# Patient Record
Sex: Male | Born: 1970 | Race: White | Hispanic: No | Smoking: Former smoker
Health system: Southern US, Community
[De-identification: ages and names within clinical notes are randomized; demographics above are authoritative.]

## PROBLEM LIST (undated history)

## (undated) DIAGNOSIS — R109 Unspecified abdominal pain: Secondary | ICD-10-CM

## (undated) DIAGNOSIS — K449 Diaphragmatic hernia without obstruction or gangrene: Secondary | ICD-10-CM

## (undated) DIAGNOSIS — A048 Other specified bacterial intestinal infections: Secondary | ICD-10-CM

## (undated) DIAGNOSIS — K219 Gastro-esophageal reflux disease without esophagitis: Secondary | ICD-10-CM

## (undated) DIAGNOSIS — G8929 Other chronic pain: Secondary | ICD-10-CM

## (undated) DIAGNOSIS — Z87891 Personal history of nicotine dependence: Secondary | ICD-10-CM

## (undated) DIAGNOSIS — F419 Anxiety disorder, unspecified: Secondary | ICD-10-CM

## (undated) DIAGNOSIS — J449 Chronic obstructive pulmonary disease, unspecified: Secondary | ICD-10-CM

## (undated) HISTORY — DX: Gastro-esophageal reflux disease without esophagitis: K21.9

## (undated) HISTORY — PX: HERNIA REPAIR: SHX51

## (undated) HISTORY — DX: Anxiety disorder, unspecified: F41.9

## (undated) HISTORY — DX: Chronic obstructive pulmonary disease, unspecified: J44.9

---

## 2003-04-20 ENCOUNTER — Emergency Department (HOSPITAL_COMMUNITY): Admission: EM | Admit: 2003-04-20 | Discharge: 2003-04-20 | Payer: Self-pay | Admitting: *Deleted

## 2007-04-10 ENCOUNTER — Emergency Department (HOSPITAL_COMMUNITY): Admission: EM | Admit: 2007-04-10 | Discharge: 2007-04-10 | Payer: Self-pay | Admitting: Emergency Medicine

## 2009-12-22 ENCOUNTER — Emergency Department (HOSPITAL_COMMUNITY): Admission: EM | Admit: 2009-12-22 | Discharge: 2009-12-23 | Payer: Self-pay | Admitting: Emergency Medicine

## 2009-12-30 ENCOUNTER — Emergency Department (HOSPITAL_COMMUNITY): Admission: EM | Admit: 2009-12-30 | Discharge: 2009-12-30 | Payer: Self-pay | Admitting: Emergency Medicine

## 2010-02-02 ENCOUNTER — Ambulatory Visit (HOSPITAL_COMMUNITY): Admission: RE | Admit: 2010-02-02 | Discharge: 2010-02-02 | Payer: Self-pay | Admitting: Surgery

## 2010-04-11 ENCOUNTER — Emergency Department (HOSPITAL_COMMUNITY): Admission: EM | Admit: 2010-04-11 | Discharge: 2010-04-12 | Payer: Self-pay | Admitting: Emergency Medicine

## 2010-04-13 ENCOUNTER — Emergency Department (HOSPITAL_COMMUNITY): Admission: EM | Admit: 2010-04-13 | Discharge: 2010-04-14 | Payer: Self-pay | Admitting: Emergency Medicine

## 2010-05-19 ENCOUNTER — Emergency Department (HOSPITAL_COMMUNITY): Admission: EM | Admit: 2010-05-19 | Discharge: 2010-05-19 | Payer: Self-pay | Admitting: Emergency Medicine

## 2010-05-21 ENCOUNTER — Emergency Department (HOSPITAL_COMMUNITY): Admission: EM | Admit: 2010-05-21 | Discharge: 2010-05-22 | Payer: Self-pay | Admitting: Emergency Medicine

## 2010-05-28 ENCOUNTER — Emergency Department (HOSPITAL_COMMUNITY): Admission: EM | Admit: 2010-05-28 | Discharge: 2010-05-28 | Payer: Self-pay | Admitting: Emergency Medicine

## 2010-05-29 ENCOUNTER — Emergency Department (HOSPITAL_COMMUNITY): Admission: EM | Admit: 2010-05-29 | Discharge: 2010-05-29 | Payer: Self-pay | Admitting: Emergency Medicine

## 2010-06-15 ENCOUNTER — Emergency Department (HOSPITAL_COMMUNITY): Admission: EM | Admit: 2010-06-15 | Discharge: 2010-06-15 | Payer: Self-pay | Admitting: Emergency Medicine

## 2010-06-29 ENCOUNTER — Ambulatory Visit: Payer: Self-pay | Admitting: Gastroenterology

## 2010-06-29 ENCOUNTER — Encounter: Payer: Self-pay | Admitting: Internal Medicine

## 2010-06-29 DIAGNOSIS — M545 Low back pain, unspecified: Secondary | ICD-10-CM | POA: Insufficient documentation

## 2010-06-29 DIAGNOSIS — R634 Abnormal weight loss: Secondary | ICD-10-CM

## 2010-06-29 DIAGNOSIS — R198 Other specified symptoms and signs involving the digestive system and abdomen: Secondary | ICD-10-CM

## 2010-06-29 DIAGNOSIS — R1013 Epigastric pain: Secondary | ICD-10-CM

## 2010-06-30 ENCOUNTER — Encounter: Payer: Self-pay | Admitting: Internal Medicine

## 2010-07-02 ENCOUNTER — Emergency Department (HOSPITAL_COMMUNITY): Admission: EM | Admit: 2010-07-02 | Discharge: 2010-07-03 | Payer: Self-pay | Admitting: Emergency Medicine

## 2010-07-07 ENCOUNTER — Ambulatory Visit (HOSPITAL_COMMUNITY): Admission: RE | Admit: 2010-07-07 | Discharge: 2010-07-07 | Payer: Self-pay | Admitting: Internal Medicine

## 2010-07-07 ENCOUNTER — Ambulatory Visit: Payer: Self-pay | Admitting: Internal Medicine

## 2010-07-10 ENCOUNTER — Encounter: Payer: Self-pay | Admitting: Internal Medicine

## 2010-07-10 ENCOUNTER — Ambulatory Visit (HOSPITAL_COMMUNITY): Admission: RE | Admit: 2010-07-10 | Discharge: 2010-07-10 | Payer: Self-pay | Admitting: Internal Medicine

## 2010-07-11 ENCOUNTER — Encounter (HOSPITAL_COMMUNITY): Admission: RE | Admit: 2010-07-11 | Discharge: 2010-08-10 | Payer: Self-pay | Admitting: Internal Medicine

## 2010-07-12 ENCOUNTER — Encounter: Payer: Self-pay | Admitting: Internal Medicine

## 2010-07-14 ENCOUNTER — Encounter: Payer: Self-pay | Admitting: Internal Medicine

## 2010-07-14 ENCOUNTER — Encounter (INDEPENDENT_AMBULATORY_CARE_PROVIDER_SITE_OTHER): Payer: Self-pay

## 2010-07-15 ENCOUNTER — Emergency Department (HOSPITAL_COMMUNITY): Admission: EM | Admit: 2010-07-15 | Discharge: 2010-07-15 | Payer: Self-pay | Admitting: Emergency Medicine

## 2010-07-18 ENCOUNTER — Encounter: Payer: Self-pay | Admitting: Internal Medicine

## 2010-11-28 NOTE — Assessment & Plan Note (Signed)
Summary: CONSTIPATION-DIVERTICULOSIS-SELF-REFERRAL/JBB   Visit Type:  Initial Visit Primary Care Provider:  None  Chief Complaint:  constipation and diverticulosis.  History of Present Illness: Warren Miller is a pleasant 40 y/o WM, who presents for further evaluation of abd pain. He has been to ED 5-6 times both at [facility] and [facility]. He had two CTs, labs and no cause of abd pain found.   Symptoms began in March, started having abd pain (diffuse), constipation. Passed tarry stool. Few days later after Ex-lax, passed mucous and bloody stool. Then developed Left inguinal hernia, repaired 4/11 by Dr. Luretha Murphy. Did good until, 6/11. Developed bad bronchitis, took several types of meds including antibiotics. Shortly after getting over bronchitis, developed more abd pain. Always has urged to have BM. Usually abd pain better after BM. Severe epigastric pain at times, n/v. No heartburn. No pp abd pain. [facility] ED physician recommended TCS. Some days without pain. Gets very anxious about symptoms. Last night, SOB, tried to use inhaler, developed sharp shooting pain in entire abd, lasted ten minutes. Feels like gas builds up. Miralax and stool softners for one week after first CT, recommended by ED physician. Has been Golytely as well. BM are very soft, 1-3 per day. Before this, would have one daily BM. 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 No more black stools since March.   Went to ED at [facility] 06/20/10 for ongoing pain. Given protonix but could not afford.   Feels bad all the time, fatigue. Also c/o lower back pain for two years with radiation down left leg and sometimes right leg. No previous w/u.   CT A/P 06/15/10: one cm right hepatic cyst, normal appendix.  CT A/P 05/28/10: appendix borderline in diameter, measuring 7m with ? very minimal stranding around mid and distal portion of appendix, scattered descending colonic diverticula.  Labs 06/15/10: lipase 28, Cre 0.91, Tbili 0.8, AP 67, AST 17, ALT 16, alb  4.1, WBC 4900, H/H 14.8/43.9, Plt 148,000.  Current Medications (verified): 1)  Protonix 40 Mg Tbec (Pantoprazole Sodium) .... Take 1 Tablet By Mouth Once A Day 2)  Maalox .... As Needed  Allergies (verified): No Known Drug Allergies  Past History:  Past Medical History: Chronic back pain with radiation down left leg and sometimes right, no w/u yet. Going on for two years.  Past Surgical History: Left inguinal hernia repair, 4/11  Family History: No FH of CRC, colon cancer. Both parents, liver problems, alcohol related.  Social History: Laid-off two yrs ago, [place]. Married. Two sons, age 74 and 51. Quit smoking 7 yrs ago, smoked for 17 yrs. Still chews tobacco. No alcohol or drugs.   Review of Systems General:  Complains of fatigue and weight loss; denies fever, chills, sweats, anorexia, and weakness; 10 pounds. Eyes:  Denies vision loss. ENT:  Denies nasal congestion, sore throat, hoarseness, and difficulty swallowing. CV:  Denies chest pains, angina, palpitations, dyspnea on exertion, and peripheral edema. Resp:  Denies dyspnea at rest, dyspnea with exercise, cough, sputum, and coughing up blood. GI:  See HPI. GU:  Denies urinary burning and blood in urine. MS:  Complains of low back pain. Derm:  Denies rash and itching. Neuro:  See HPI; Denies weakness, frequent headaches, memory loss, and confusion. Psych:  Denies depression and anxiety. Endo:  Complains of unusual weight change. Heme:  Denies bruising and bleeding. Allergy:  Denies hives and rash.  Vital Signs:  Patient profile:   40 year old male Height:  73 inches Weight:      166 pounds BMI:     21.98 Temp:     98.0 degrees F oral Pulse rate:   72 / minute BP sitting:   120 / 80  (left arm) Cuff size:   regular  Vitals Entered By: Cloria Spring LPN (June 29, 2010 2:14 PM)  Physical Exam  General:  Well developed, well nourished, no acute distress. Head:  Normocephalic and atraumatic. Eyes:   sclera nonicteric Mouth:  Oropharyngeal mucosa moist, pink.  No lesions, erythema or exudate.   poor dentition.   Neck:  Supple; no masses or thyromegaly. Lungs:  Clear throughout to auscultation. Heart:  Regular rate and rhythm; no murmurs, rubs,  or bruits. Abdomen:  Soft. Flat. Positive BS. Moderate epig tenderness. No HSM or masses. No abd bruit or hernia. No rebound or guarding.  Rectal:  deferred until time of colonoscopy.   Extremities:  No clubbing, cyanosis, edema or deformities noted. Neurologic:  Alert and  oriented x4;  grossly normal neurologically. Skin:  Intact without significant lesions or rashes. Cervical Nodes:  No significant cervical adenopathy. Psych:  Alert and cooperative. Normal mood and affect.  Impression & Recommendations:  Problem # 1:  EPIGASTRIC PAIN (ICD-789.06)  Severe intermittent epigastric pain unrelated to meals. No typical heartburn. Some N/V. Remote black stool and bloody stool.  CT X 2 unrevealing. Labs okay. Start Dexilant 60mg  by mouth daily. #20 given. EGD to r/o PUD, malignancy, atypical GERD. EGD to be performed in near future.  Risks, alternatives, benefits including but not limited to risk of reaction to medications, bleeding, infection, and perforation addressed.  Patient voiced understanding and verbal consent obtained.   Orders: New Patient Level III (84132)  Problem # 2:  CHANGE IN BOWELS (ICD-787.99)  Change in bowels with more frequent stools and intermittent diffuse abd pain. Pain usually better post-BM. Overall fatigue/feels poorly, 10 pound wt loss associated with change in bowels last two months. Etiology not clear. Initial CT showed borderline diameter of appendix but looked normal on second CT. Colonoscopy to be performed in near future.  Risks, alternatives, and benefits including but not limited to the risk of reaction to medication, bleeding, infection, and perforation were addressed.  Patient voiced understanding and provided  verbal consent.   Orders: New Patient Level III (44010)  Problem # 3:  BACK PAIN, LUMBAR, CHRONIC (ICD-724.2)  Two year h/o chronic lower back pain with radiculopathy. Consider MRI lumbar spine after initial w/u of GI issues. Patient does not have PCP.   Orders: New Patient Level III (27253)  Appended Document: CONSTIPATION-DIVERTICULOSIS-SELF-REFERRAL/JBB Reviewed records from [facility] ED. AAS, single loop of bowel upper limits of normal in distention in RLQ. Nonspecific finding.

## 2010-11-28 NOTE — Miscellaneous (Signed)
Summary: path  Clinical Lists Changes SP-Surgical Pathology - STATUS: Final  .                                         Perform Date: 20 Sep11 00:01  Ordered By: DEFAULT MD , PROVIDER         Ordered Date:  Facility: [facility]                               Department: CPATH  Service Report Text  [facility]   [address]   [place] , [place] [address]   Telephone : [phone] Fax : [phone]    REPORT OF SURGICAL PATHOLOGY    Accession #: [id number]   Patient Name: Warren Miller, Warren Miller   Visit # : [id number]    MRN: [id number]   Physician: Eula Listen   DOB/Age [birth year] (Age: 9) Gender: M   Collected Date: 07/07/2010   Received Date: 07/10/2010    FINAL DIAGNOSIS    1. Colon, polyp(s), between sigmoid/descending :   TUBULAR ADENOMA.NO HIGH GRADE DYSPLASIA OR MALIGNANCY IDENTIFIED (ONE FRAGMENT).    2. Colon, polyp(s), sigmoid :   TUBULAR ADENOMA.NO HIGH GRADE DYSPLASIA OR MALIGNANCY IDENTIFIED (ONE FRAGMENT).    ELECTRONIC SIGNATURE : Dierdre Searles M.D., Gwendolyn Grant, Pathologist, Electronic Signature    MICROSCOPIC DESCRIPTION    CASE COMMENTS    CLINICAL HISTORY    SPECIMEN(S) OBTAINED   1. Colon, polyp(s), Between Sigmoid/descending   2. Colon, polyp(s), Sigmoid    SPECIMEN COMMENTS:   2. abdominal pain; change in bowel habits    Gross Description   1. Received in formalin are tan, soft tissue fragments that are submitted in   toto.Number: one, Size: 0.4 cm.   2. Received in formalin are tan, soft tissue fragments that are submitted in   toto.Number: one, Size: 0.3 cm.(GP:gt, 07/10/10)    Report signed out from the following location(s)   [facility]   [address], [place] [address] CLIA #: [id number]    [facility]   [address] [place], [place] [address] CLIA #: [id number]  Additional Information  HL7 RESULT STATUS : F  External IF Update Timestamp : 2010-07-11:17:03:00.000000

## 2010-11-28 NOTE — Letter (Signed)
Summary: HIDA SCAN ORDER  HIDA SCAN ORDER   Imported By: Ave Filter 07/10/2010 12:24:58  _____________________________________________________________________  External Attachment:    Type:   Image     Comment:   External Document

## 2010-11-28 NOTE — Letter (Signed)
Summary: LABS  LABS   Imported By: Rexene Alberts 06/30/2010 10:59:36  _____________________________________________________________________  External Attachment:    Type:   Image     Comment:   External Document

## 2010-11-28 NOTE — Letter (Signed)
Summary: RADIOLOGY CT ABD/PEL  RADIOLOGY CT ABD/PEL   Imported By: Rexene Alberts 06/30/2010 10:58:00  _____________________________________________________________________  External Attachment:    Type:   Image     Comment:   External Document

## 2010-11-28 NOTE — Letter (Signed)
Summary: Patient Notice, Colon Biopsy Results  [facility]  [address]   [place], [place] [address]   Phone: [phone]  Fax: [phone]       July 12, 2010   Warren Miller [address] [facility] [address] [place], [place]  [address] 12-03-1970    Dear [name],  I am pleased to inform you that the biopsies taken during your recent colonoscopy did not show any evidence of cancer upon pathologic examination.  Additional information/recommendations:  You should have a repeat colonoscopy examination  in 7 years.  Please call us if you are having persistent problems or have questions about your condition that have not been fully answered at this time.  Sincerely,    R. Roetta Sessions MD, FACP [facility] Ph: [phone]    Fax: [phone]   Appended Document: Patient Notice, Colon Biopsy Results letter mailed to pt

## 2010-11-28 NOTE — Letter (Signed)
Summary: HIDA SCAN ORDER  HIDA SCAN ORDER   Imported By: Ave Filter 07/10/2010 14:10:16  _____________________________________________________________________  External Attachment:    Type:   Image     Comment:   External Document

## 2010-11-28 NOTE — Letter (Signed)
Summary: ED NOTES FROM [facility]  ED NOTES FROM [facility]   Imported By: Rexene Alberts 07/14/2010 09:49:50  _____________________________________________________________________  External Attachment:    Type:   Image     Comment:   External Document

## 2010-11-28 NOTE — Letter (Signed)
Summary: Patient Notice, Colon Biopsy Results  [facility]  [address]   [place], [place] [address]   Phone: [phone]  Fax: [phone]       July 18, 2010   NATHANIAL ARRIGHI [address] [facility] [address] [place], [place]  [address] December 03, 1970    Dear Mr. TROUTEN,  I am pleased to inform you that the biopsies taken during your recent colonoscopy did not show any evidence of cancer upon pathologic examination.  Additional information/recommendations:  Continue with the treatment plan as outlined on the day of your exam.  You should have a repeat colonoscopy examination  in 7 years.  Please call us if you are having persistent problems or have questions about your condition that have not been fully answered at this time.  Sincerely,    R. Roetta Sessions MD, FACP [facility] Ph: [phone]    Fax: [phone]   Appended Document: Patient Notice, Colon Biopsy Results Letter mailed to pt.  Appended Document: Patient Notice, Colon Biopsy Results Reminder placed in idx.

## 2010-11-28 NOTE — Letter (Signed)
Summary: EGD/TCS ORDER  EGD/TCS ORDER   Imported By: Ave Filter 06/29/2010 15:29:53  _____________________________________________________________________  External Attachment:    Type:   Image     Comment:   External Document

## 2010-12-30 ENCOUNTER — Emergency Department (HOSPITAL_COMMUNITY): Payer: Self-pay

## 2010-12-30 ENCOUNTER — Emergency Department (HOSPITAL_COMMUNITY)
Admission: EM | Admit: 2010-12-30 | Discharge: 2010-12-30 | Disposition: A | Discharge status: Home / Self Care | Payer: Self-pay | Attending: Emergency Medicine | Admitting: Emergency Medicine

## 2010-12-30 ENCOUNTER — Encounter (HOSPITAL_COMMUNITY): Payer: Self-pay | Admitting: Radiology

## 2010-12-30 DIAGNOSIS — R0602 Shortness of breath: Secondary | ICD-10-CM | POA: Insufficient documentation

## 2010-12-30 DIAGNOSIS — F411 Generalized anxiety disorder: Secondary | ICD-10-CM | POA: Insufficient documentation

## 2010-12-30 DIAGNOSIS — R0989 Other specified symptoms and signs involving the circulatory and respiratory systems: Secondary | ICD-10-CM | POA: Insufficient documentation

## 2010-12-30 DIAGNOSIS — R0609 Other forms of dyspnea: Secondary | ICD-10-CM | POA: Insufficient documentation

## 2010-12-30 HISTORY — DX: Personal history of nicotine dependence: Z87.891

## 2011-01-11 LAB — COMPREHENSIVE METABOLIC PANEL
AST: 17 U/L (ref 0–37)
Albumin: 4 g/dL (ref 3.5–5.2)
Alkaline Phosphatase: 67 U/L (ref 39–117)
Chloride: 103 mEq/L (ref 96–112)
GFR calc Af Amer: >60 mL/min (ref >60)
Glucose, Bld: 89 mg/dL (ref 70–99)
Sodium: 137 mEq/L (ref 135–145)
Total Bilirubin: 0.8 mg/dL (ref 0.3–1.2)
Total Protein: 6.8 g/dL (ref 6.0–8.3)

## 2011-01-11 LAB — URINALYSIS, ROUTINE W REFLEX MICROSCOPIC
Bilirubin Urine: NEGATIVE
Glucose, UA: NEGATIVE mg/dL
Ketones, ur: 15 mg/dL — AB
Nitrite: NEGATIVE
Protein, ur: NEGATIVE mg/dL
Urobilinogen, UA: 0.2 mg/dL (ref 0.0–1.0)
pH: 7 (ref 5.0–8.0)

## 2011-01-11 LAB — CBC
HCT: 42.5 % (ref 39.0–52.0)
Hemoglobin: 14.8 g/dL (ref 13.0–17.0)
MCH: 30.8 pg (ref 26.0–34.0)
MCHC: 34.5 g/dL (ref 30.0–36.0)
Platelets: 151 10*3/uL (ref 150–400)
RBC: 4.87 MIL/uL (ref 4.22–5.81)
RDW: 12.7 % (ref 11.5–15.5)
RDW: 12.8 % (ref 11.5–15.5)

## 2011-01-11 LAB — DIFFERENTIAL
Basophils Absolute: 0 10*3/uL (ref 0.0–0.1)
Eosinophils Relative: 2 % (ref 0–5)
Lymphocytes Relative: 26 % (ref 12–46)
Monocytes Absolute: 0.5 10*3/uL (ref 0.1–1.0)
Monocytes Absolute: 0.4 10*3/uL (ref 0.1–1.0)
Monocytes Relative: 9 % (ref 3–12)
Neutro Abs: 3.4 10*3/uL (ref 1.7–7.7)
Neutrophils Relative %: 70 % (ref 43–77)

## 2011-01-11 LAB — BASIC METABOLIC PANEL
Calcium: 9.6 mg/dL (ref 8.4–10.5)
Chloride: 104 mEq/L (ref 96–112)
GFR calc non Af Amer: >60 mL/min (ref >60)
Potassium: 3.4 mEq/L — ABNORMAL LOW (ref 3.5–5.1)

## 2011-01-11 LAB — LIPASE, BLOOD: Lipase: 28 U/L (ref 11–59)

## 2011-01-12 LAB — POCT I-STAT, CHEM 8
BUN: 6 mg/dL (ref 6–23)
Calcium, Ion: 1.05 mmol/L — ABNORMAL LOW (ref 1.12–1.32)
Chloride: 106 mEq/L (ref 96–112)
Creatinine, Ser: 0.8 mg/dL (ref 0.4–1.5)
Hemoglobin: 15.3 g/dL (ref 13.0–17.0)
Potassium: 3.5 mEq/L (ref 3.5–5.1)
Sodium: 139 mEq/L (ref 135–145)
TCO2: 24 mmol/L (ref 0–100)

## 2011-01-12 LAB — POCT CARDIAC MARKERS: CKMB, poc: <1 ng/mL — ABNORMAL LOW (ref 1.0–8.0)

## 2011-01-12 LAB — D-DIMER, QUANTITATIVE: D-Dimer, Quant: 0.22 ug/mL-FEU (ref 0.00–0.48)

## 2011-01-13 LAB — URINALYSIS, ROUTINE W REFLEX MICROSCOPIC
Bilirubin Urine: NEGATIVE
Glucose, UA: NEGATIVE mg/dL
Hgb urine dipstick: NEGATIVE
Ketones, ur: NEGATIVE mg/dL
Protein, ur: NEGATIVE mg/dL
pH: 6 (ref 5.0–8.0)

## 2011-01-13 LAB — DIFFERENTIAL
Basophils Relative: 0 % (ref 0–1)
Eosinophils Absolute: 0 10*3/uL (ref 0.0–0.7)
Eosinophils Absolute: 0.1 10*3/uL (ref 0.0–0.7)
Eosinophils Relative: 1 % (ref 0–5)
Lymphocytes Relative: 20 % (ref 12–46)
Lymphs Abs: 1.2 10*3/uL (ref 0.7–4.0)
Lymphs Abs: 1.4 10*3/uL (ref 0.7–4.0)
Monocytes Absolute: 0.5 10*3/uL (ref 0.1–1.0)
Neutro Abs: 6.5 10*3/uL (ref 1.7–7.7)
Neutrophils Relative %: 76 % (ref 43–77)

## 2011-01-13 LAB — LIPASE, BLOOD: Lipase: 28 U/L (ref 11–59)

## 2011-01-13 LAB — CBC
HCT: 43.4 % (ref 39.0–52.0)
HCT: 44.6 % (ref 39.0–52.0)
Hemoglobin: 15.1 g/dL (ref 13.0–17.0)
Hemoglobin: 15.3 g/dL (ref 13.0–17.0)
MCH: 30.6 pg (ref 26.0–34.0)
MCH: 30.9 pg (ref 26.0–34.0)
MCHC: 34.9 g/dL (ref 30.0–36.0)
MCHC: 34.3 g/dL (ref 30.0–36.0)
MCV: 90.1 fL (ref 78.0–100.0)

## 2011-01-13 LAB — BASIC METABOLIC PANEL
BUN: 9 mg/dL (ref 6–23)
GFR calc Af Amer: >60 mL/min (ref >60)
GFR calc non Af Amer: >60 mL/min (ref >60)
Glucose, Bld: 122 mg/dL — ABNORMAL HIGH (ref 70–99)

## 2011-01-13 LAB — COMPREHENSIVE METABOLIC PANEL
ALT: 25 U/L (ref 0–53)
BUN: 5 mg/dL — ABNORMAL LOW (ref 6–23)
CO2: 27 mEq/L (ref 19–32)
Calcium: 9.1 mg/dL (ref 8.4–10.5)
GFR calc non Af Amer: >60 mL/min (ref >60)
Glucose, Bld: 109 mg/dL — ABNORMAL HIGH (ref 70–99)
Sodium: 138 mEq/L (ref 135–145)

## 2011-01-15 ENCOUNTER — Emergency Department (HOSPITAL_COMMUNITY)
Admission: EM | Admit: 2011-01-15 | Discharge: 2011-01-15 | Disposition: A | Discharge status: Home / Self Care | Payer: Self-pay | Attending: Emergency Medicine | Admitting: Emergency Medicine

## 2011-01-15 ENCOUNTER — Emergency Department (HOSPITAL_COMMUNITY): Payer: Self-pay

## 2011-01-15 DIAGNOSIS — F411 Generalized anxiety disorder: Secondary | ICD-10-CM | POA: Insufficient documentation

## 2011-01-15 DIAGNOSIS — R079 Chest pain, unspecified: Secondary | ICD-10-CM | POA: Insufficient documentation

## 2011-01-15 LAB — DIFFERENTIAL
Basophils Relative: 1 % (ref 0–1)
Eosinophils Absolute: 0 10*3/uL (ref 0.0–0.7)
Lymphs Abs: 1.1 10*3/uL (ref 0.7–4.0)
Neutrophils Relative %: 60 % (ref 43–77)

## 2011-01-15 LAB — POCT CARDIAC MARKERS
CKMB, poc: <1 ng/mL — ABNORMAL LOW (ref 1.0–8.0)
Troponin i, poc: <0.05 ng/mL (ref 0.00–0.09)

## 2011-01-15 LAB — COMPREHENSIVE METABOLIC PANEL
Alkaline Phosphatase: 68 U/L (ref 39–117)
BUN: 7 mg/dL (ref 6–23)
CO2: 25 mEq/L (ref 19–32)
Calcium: 9.1 mg/dL (ref 8.4–10.5)
Glucose, Bld: 95 mg/dL (ref 70–99)
Total Protein: 6.7 g/dL (ref 6.0–8.3)

## 2011-01-15 LAB — CBC
Platelets: 178 10*3/uL (ref 150–400)
RBC: 5.06 MIL/uL (ref 4.22–5.81)
WBC: 4 10*3/uL (ref 4.0–10.5)

## 2011-01-19 LAB — URINALYSIS, ROUTINE W REFLEX MICROSCOPIC
Glucose, UA: NEGATIVE mg/dL
Protein, ur: NEGATIVE mg/dL
pH: 7 (ref 5.0–8.0)

## 2011-01-22 LAB — URINALYSIS, ROUTINE W REFLEX MICROSCOPIC
Ketones, ur: NEGATIVE mg/dL
Nitrite: NEGATIVE
pH: 7.5 (ref 5.0–8.0)

## 2011-01-31 IMAGING — US US ABDOMEN COMPLETE
1 series · 14 of 25 positions shown · non-contrast
Comparison: CT 06/15/2010

CLINICAL DATA: Pain

COMPLETE ABDOMINAL ULTRASOUND

[Series 1: us abdomen complete · 0.26mm/px · 14 of 73 slices shown]
[im 1/73]
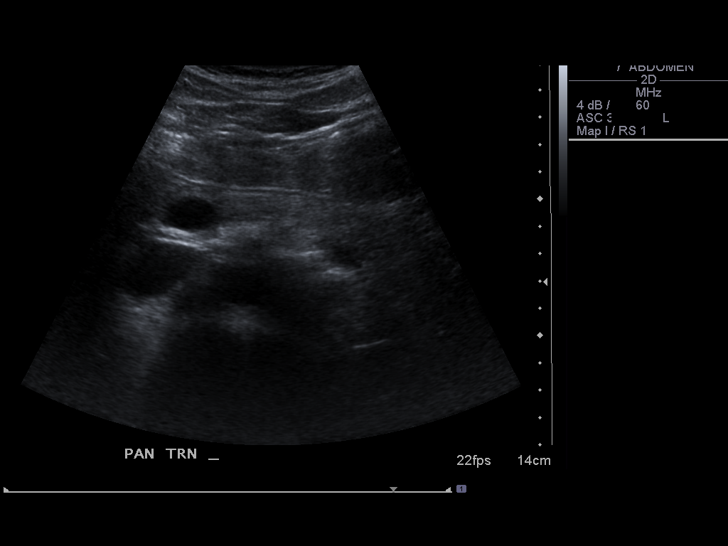
[im 7/73]
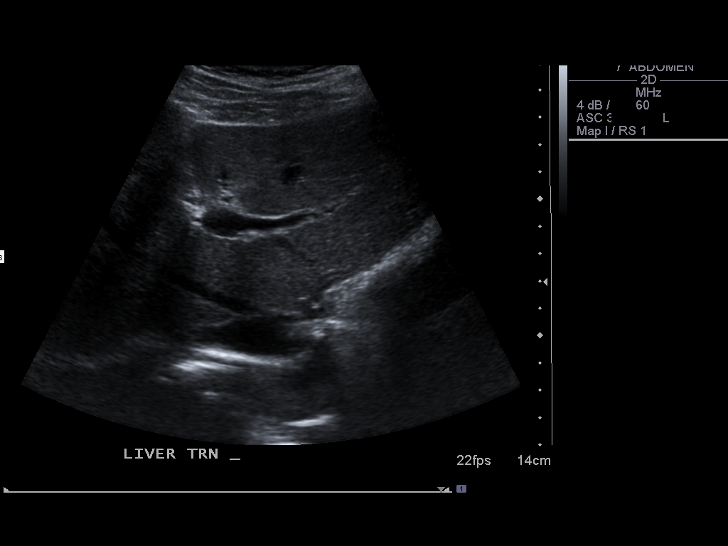
[im 13/73]
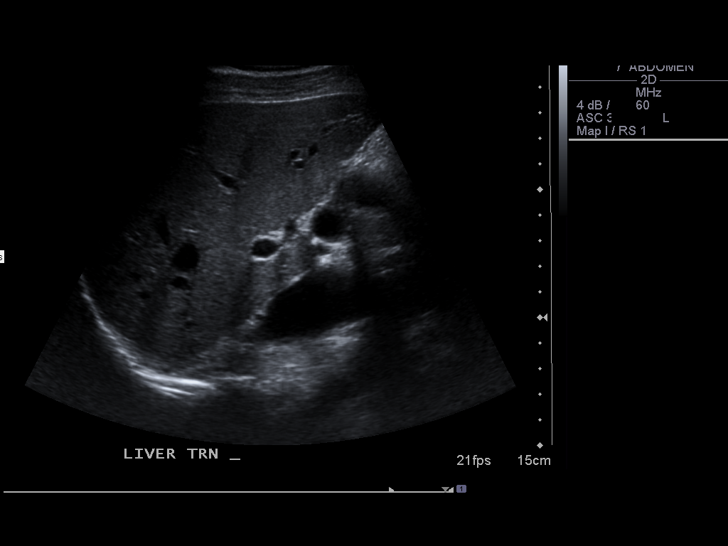
[im 19/73]
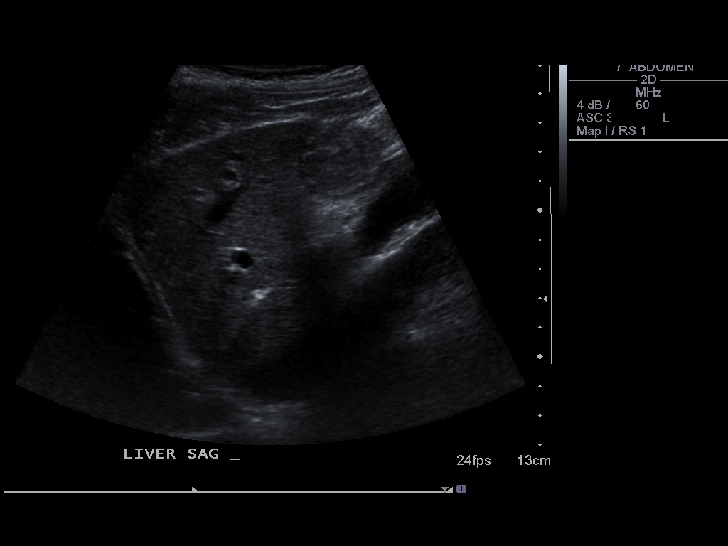
[im 25/73]
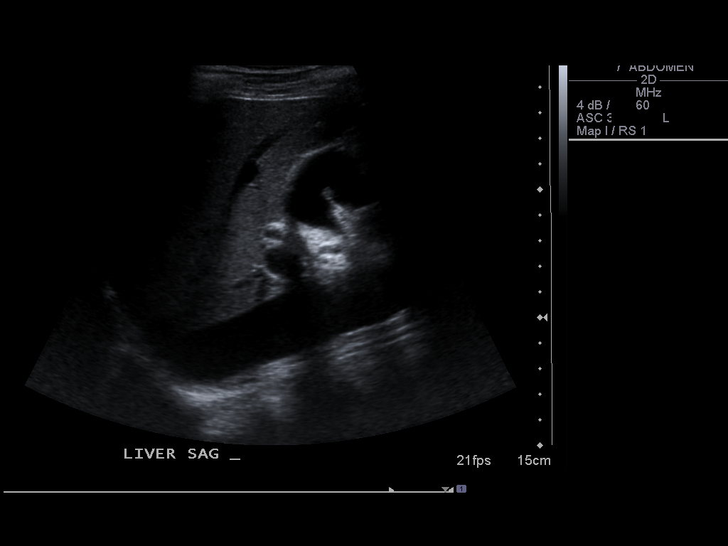
[im 28/73]
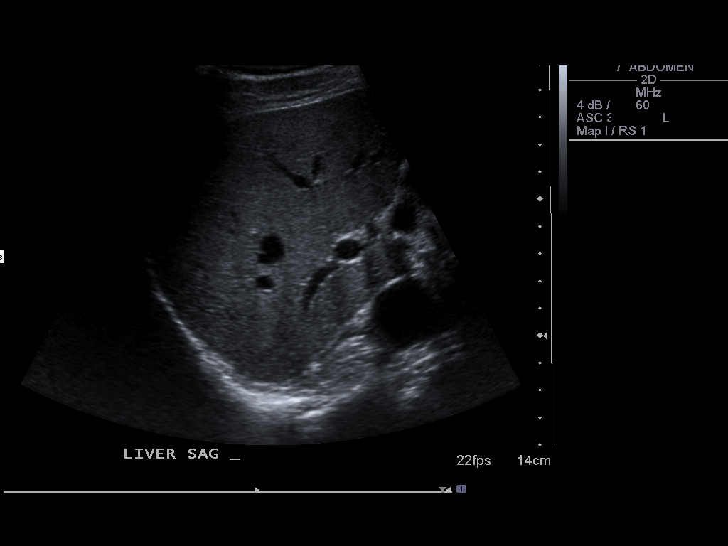
[im 34/73]
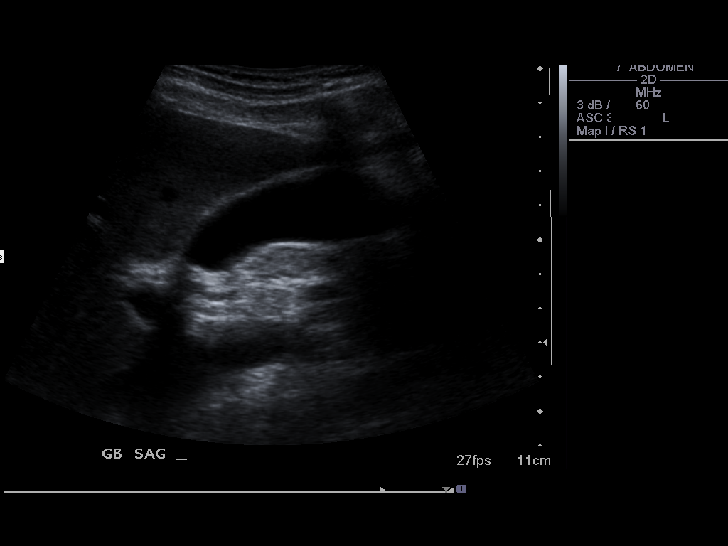
[im 40/73]
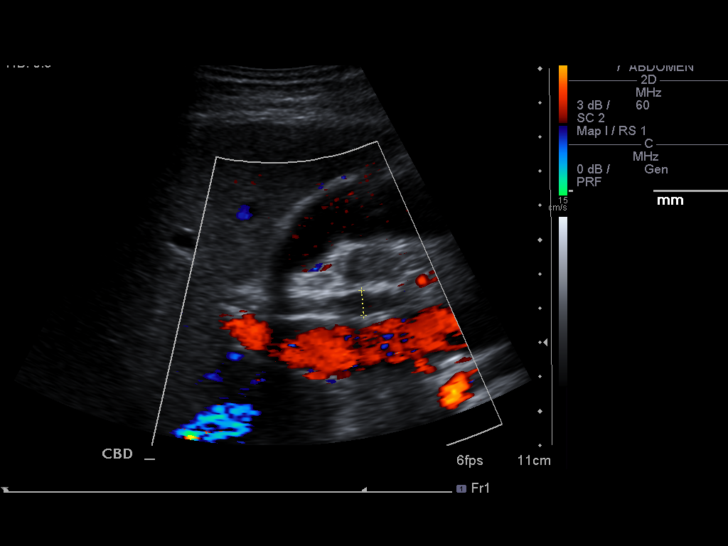
[im 46/73]
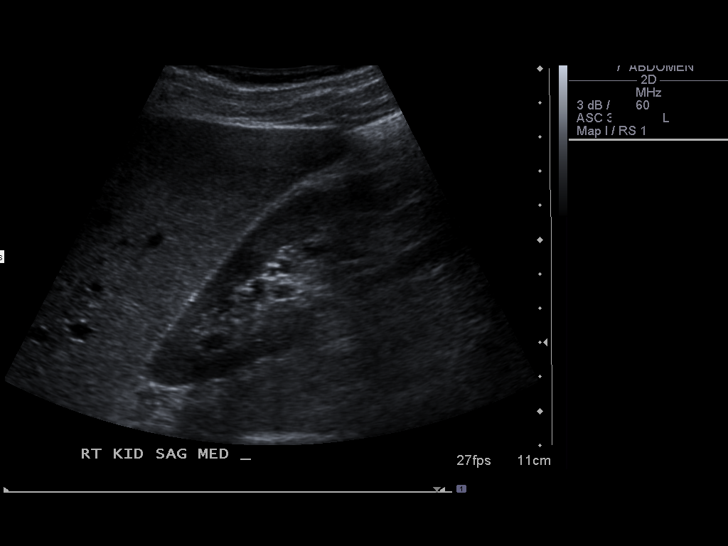
[im 49/73]
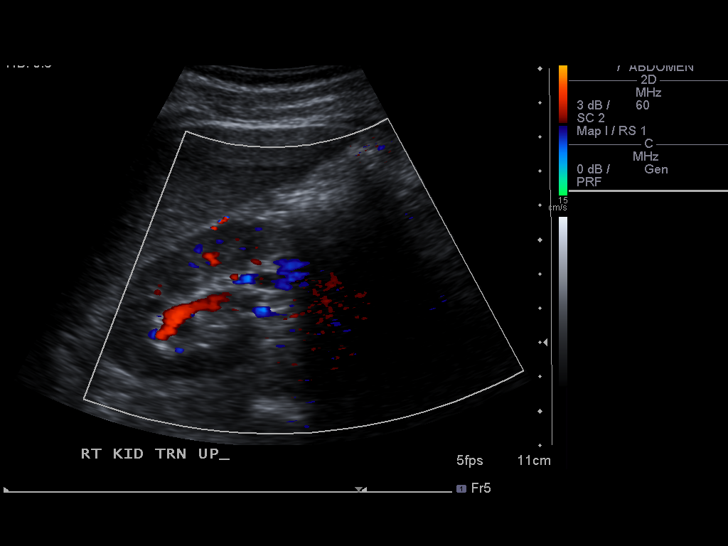
[im 55/73]
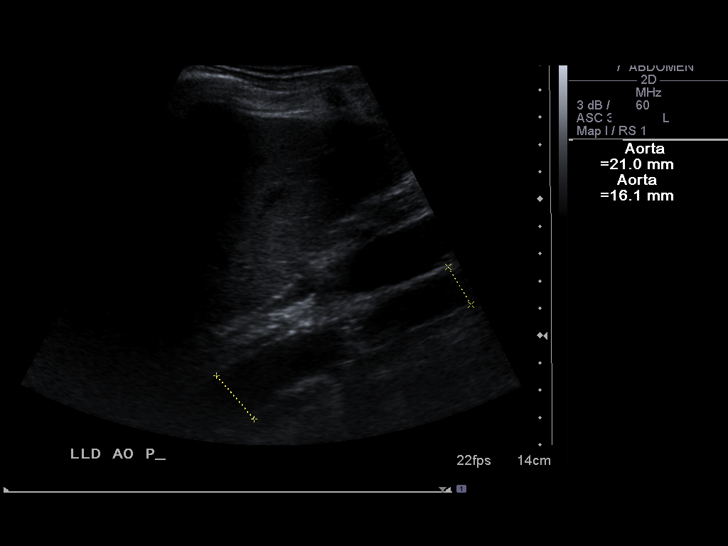
[im 61/73]
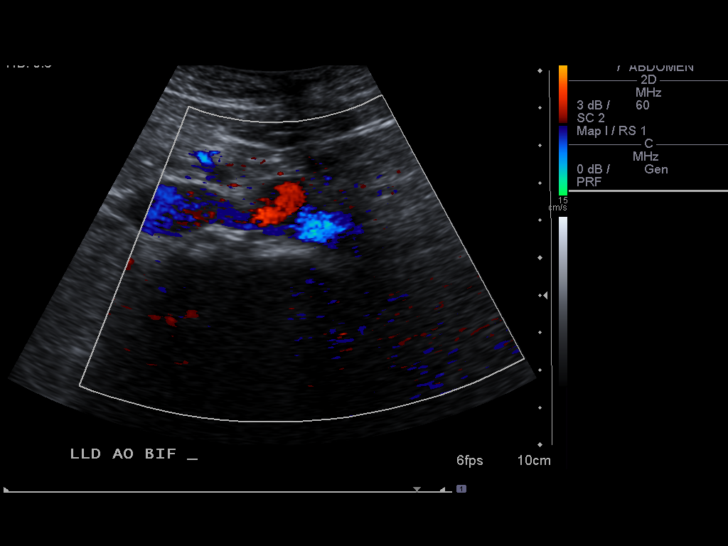
[im 67/73]
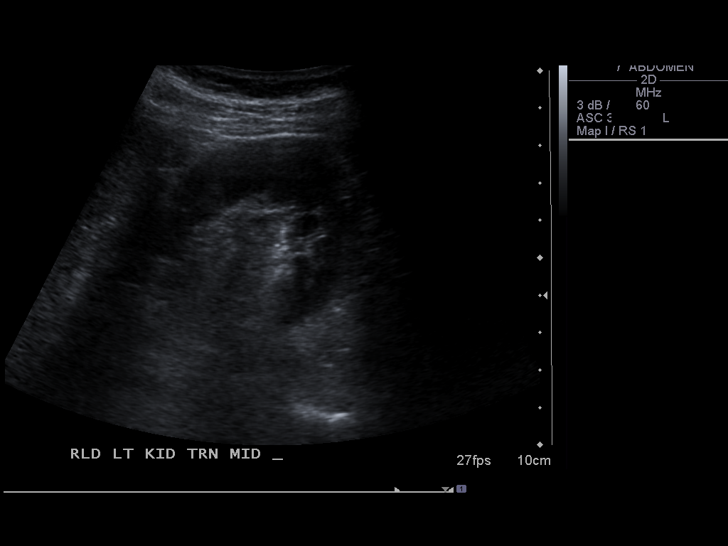
[im 73/73]
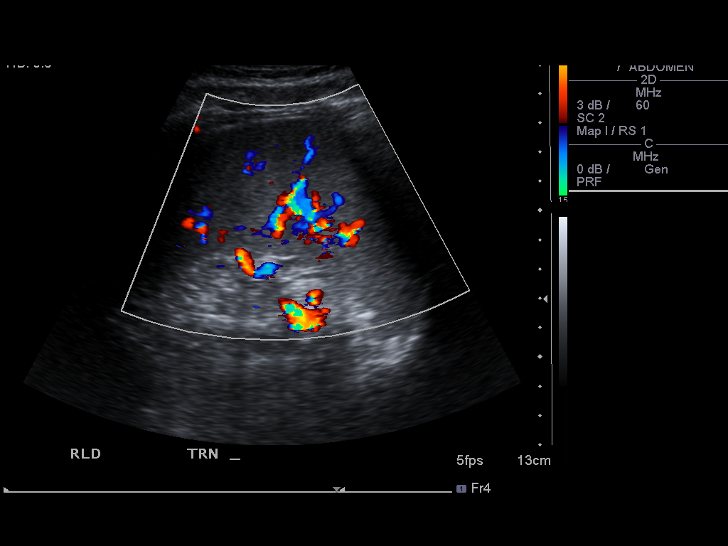

[14 of 25 positions shown; findings below may reference images not displayed]

FINDINGS: Gallbladder:  No gallstones, gallbladder wall thickening, or
pericholecystic fluid. Sonographer reports no sonographic Murphy's
sign.

Common bile duct:  Mildly dilated, 7.3 mm diameter

Liver:  No focal lesion identified.  Within normal limits in
parenchymal echogenicity.

IVC:  Appears normal.

Pancreas:  No focal abnormality seen.

Spleen:  8 cm craniocaudal length, unremarkable

Right Kidney:  11.6 cm. No hydronephrosis.  Well-preserved cortex.
Normal size and parenchymal echotexture without focal
abnormalities.

Left Kidney:  10.2 cm. No hydronephrosis.  Well-preserved cortex.
Normal size and parenchymal echotexture without focal
abnormalities. .

Abdominal aorta:  No aneurysm identified.
IMPRESSION: 1.  Mild dilatation of the common bile duct without intrahepatic
biliary ductal dilatation or cholelithiasis.  Correlate with any
clinical or laboratory evidence of obstruction.

## 2011-03-29 ENCOUNTER — Emergency Department (HOSPITAL_COMMUNITY)
Admission: EM | Admit: 2011-03-29 | Discharge: 2011-03-29 | Disposition: A | Discharge status: Home / Self Care | Payer: No Typology Code available for payment source | Attending: Emergency Medicine | Admitting: Emergency Medicine

## 2011-03-29 ENCOUNTER — Emergency Department (HOSPITAL_COMMUNITY): Payer: No Typology Code available for payment source

## 2011-03-29 DIAGNOSIS — M542 Cervicalgia: Secondary | ICD-10-CM | POA: Insufficient documentation

## 2011-03-29 DIAGNOSIS — S335XXA Sprain of ligaments of lumbar spine, initial encounter: Secondary | ICD-10-CM | POA: Insufficient documentation

## 2011-03-29 DIAGNOSIS — Y998 Other external cause status: Secondary | ICD-10-CM | POA: Insufficient documentation

## 2011-03-29 DIAGNOSIS — F17200 Nicotine dependence, unspecified, uncomplicated: Secondary | ICD-10-CM | POA: Insufficient documentation

## 2012-06-03 ENCOUNTER — Other Ambulatory Visit: Payer: Self-pay

## 2012-06-03 DIAGNOSIS — J449 Chronic obstructive pulmonary disease, unspecified: Secondary | ICD-10-CM

## 2012-06-19 ENCOUNTER — Ambulatory Visit (HOSPITAL_COMMUNITY)
Admission: RE | Admit: 2012-06-19 | Discharge: 2012-06-19 | Disposition: A | Discharge status: Home / Self Care | Payer: Self-pay | Source: Ambulatory Visit | Attending: Physician Assistant | Admitting: Physician Assistant

## 2012-06-19 DIAGNOSIS — R0602 Shortness of breath: Secondary | ICD-10-CM | POA: Insufficient documentation

## 2012-06-19 NOTE — Procedures (Signed)
NAME:  Warren Miller, Warren Miller NO.:  [id number]  MEDICAL RECORD NO.:  [id number]  LOCATION:  RESP                          FACILITY:  [facility]  PHYSICIAN:  Artin Mceuen L. Juanetta Gosling, M.D.DATE OF BIRTH:  April 18, 1971  DATE OF PROCEDURE: DATE OF DISCHARGE:                           PULMONARY FUNCTION TEST   Reason for pulmonary function testing is COPD.  1. Spirometry shows no ventilatory defect and no evidence of airflow     obstruction. 2. Lung volumes show air trapping. 3. DLCO is normal. 4. Airway resistance is somewhat low. 5. Although he has a clinical diagnosis of COPD, his current pulmonary     function test is essentially normal.     Cortny Bambach L. Juanetta Gosling, M.D.     [facility]/MEDQ  D:  06/19/2012  T:  06/19/2012  Job:  [id number]

## 2012-06-25 LAB — PULMONARY FUNCTION TEST

## 2012-10-11 ENCOUNTER — Encounter (HOSPITAL_COMMUNITY): Payer: Self-pay

## 2012-10-11 ENCOUNTER — Emergency Department (HOSPITAL_COMMUNITY)
Admission: EM | Admit: 2012-10-11 | Discharge: 2012-10-11 | Disposition: A | Discharge status: Home / Self Care | Payer: Self-pay | Attending: Emergency Medicine | Admitting: Emergency Medicine

## 2012-10-11 DIAGNOSIS — K0889 Other specified disorders of teeth and supporting structures: Secondary | ICD-10-CM

## 2012-10-11 DIAGNOSIS — K029 Dental caries, unspecified: Secondary | ICD-10-CM | POA: Insufficient documentation

## 2012-10-11 DIAGNOSIS — K089 Disorder of teeth and supporting structures, unspecified: Secondary | ICD-10-CM | POA: Insufficient documentation

## 2012-10-11 DIAGNOSIS — Z8619 Personal history of other infectious and parasitic diseases: Secondary | ICD-10-CM | POA: Insufficient documentation

## 2012-10-11 DIAGNOSIS — Z87891 Personal history of nicotine dependence: Secondary | ICD-10-CM | POA: Insufficient documentation

## 2012-10-11 HISTORY — DX: Other specified bacterial intestinal infections: A04.8

## 2012-10-11 MED ORDER — HYDROCODONE-ACETAMINOPHEN 5-325 MG PO TABS: 1.0000 | ORAL_TABLET | Freq: Four times a day (QID) | ORAL | Status: AC | PRN

## 2012-10-11 MED ORDER — AMOXICILLIN 500 MG PO CAPS: 500.0000 mg | ORAL_CAPSULE | Freq: Three times a day (TID) | ORAL | Status: DC

## 2012-10-11 NOTE — ED Notes (Signed)
Pt reports toothache to front of mouth

## 2012-10-11 NOTE — ED Provider Notes (Signed)
History     CSN: [id number]  Arrival date & time 10/11/12  1027   First MD Initiated Contact with Patient 10/11/12 1036      Chief Complaint  Patient presents with  . Dental Pain    (Consider location/radiation/quality/duration/timing/severity/associated sxs/prior treatment) HPI Comments: Trying to get seen at a dental clinic.  Patient is a 41 y.o. male presenting with tooth pain. The history is provided by the patient. No language interpreter was used.  Dental PainThe primary symptoms include mouth pain. Primary symptoms do not include dental injury or fever. Episode onset: chronic. The symptoms are worsening. The symptoms occur constantly.  Additional symptoms do not include: dental sensitivity to temperature, purulent gums, trismus, facial swelling and swollen glands.    Past Medical History  Diagnosis Date  . Former heavy cigarette smoker (20-39 per day)   . H. pylori infection     Past Surgical History  Procedure Date  . Hernia repair     No family history on file.  History  Substance Use Topics  . Smoking status: Smoker, Current Status Unknown  . Smokeless tobacco: Not on file  . Alcohol Use: No      Review of Systems  Constitutional: Negative for fever and chills.  HENT: Positive for dental problem. Negative for facial swelling.   All other systems reviewed and are negative.    Allergies  Other  Home Medications   Current Outpatient Rx  Name  Route  Sig  Dispense  Refill  . CLONAZEPAM 0.5 MG PO TABS   Oral   Take 0.5 mg by mouth 5 (five) times daily.         . IBUPROFEN 200 MG PO TABS   Oral   Take 200 mg by mouth every 6 (six) hours as needed. For pain         . AMOXICILLIN 500 MG PO CAPS   Oral   Take 1 capsule (500 mg total) by mouth 3 (three) times daily.   30 capsule   0   . HYDROCODONE-ACETAMINOPHEN 5-325 MG PO TABS   Oral   Take 1 tablet by mouth every 6 (six) hours as needed for pain.   20 tablet   0     BP 111/60   Pulse 88  Temp 97.9 F (36.6 C) (Oral)  Resp 18  Wt 176 lb (79.833 kg)  SpO2 100%  Physical Exam  Nursing note and vitals reviewed. Constitutional: He is oriented to person, place, and time. He appears well-developed and well-nourished.  HENT:  Head: Normocephalic and atraumatic.  Mouth/Throat: Uvula is midline, oropharynx is clear and moist and mucous membranes are normal. Dental caries present. No uvula swelling.    Eyes: EOM are normal.  Neck: Normal range of motion.  Cardiovascular: Normal rate, regular rhythm and intact distal pulses.   Pulmonary/Chest: Effort normal. No respiratory distress.  Abdominal: Soft. He exhibits no distension. There is no tenderness.  Musculoskeletal: Normal range of motion.  Neurological: He is alert and oriented to person, place, and time.  Skin: Skin is warm and dry.  Psychiatric: He has a normal mood and affect. Judgment normal.    ED Course  Procedures (including critical care time)  Labs Reviewed - No data to display No results found.   1. Pain, dental       MDM  rx-amoxil 500 mg, 30 rx-hydrocodone, 20 Ibuprofen F/u with dentist.        Evalina Field, PA 10/11/12 1111

## 2012-10-11 NOTE — ED Provider Notes (Signed)
Medical screening examination/treatment/procedure(s) were performed by non-physician practitioner and as supervising physician I was immediately available for consultation/collaboration. Kadisha Goodine, MD, FACEP   Talton Delpriore L Sholom Dulude, MD 10/11/12 1455 

## 2013-02-16 ENCOUNTER — Telehealth: Payer: Self-pay

## 2013-02-16 ENCOUNTER — Telehealth: Payer: Self-pay | Admitting: Family Medicine

## 2013-02-16 NOTE — Telephone Encounter (Signed)
For what?

## 2013-02-16 NOTE — Telephone Encounter (Signed)
Ok reviewed.

## 2013-02-16 NOTE — Telephone Encounter (Signed)
Left message for pt to return call.

## 2013-02-16 NOTE — Telephone Encounter (Signed)
Patient wants a referral to [facility] for MRI that Greig Castilla was suppose to schedule

## 2013-02-16 NOTE — Telephone Encounter (Signed)
Please review and if no further action required,  please close encounter.  Had no insurance.  Has had lower back pain with a sharp pain off and on. ACaryn Bee had suggested an mri would be helpful in the future.  At [facility] he applied for some health assistance and was told he might could get a referral started while waiting to see if his medicaid would come through.  Without any insurance or cash I said it probably was best to wait for coverage.  He had an x-ray that apparently showed no problems.

## 2013-02-17 NOTE — Telephone Encounter (Signed)
Per Dr. Modesto Charon patient will need to be seen again and have lumbar x-rays before an MRI can be scheduled.

## 2013-02-17 NOTE — Telephone Encounter (Signed)
Left message on voicemail for patient to return call. 

## 2013-02-19 NOTE — Telephone Encounter (Signed)
Left messon cell phone  to call for appt  Mess left on vm to call for appt on his home number Have tried and left messages since 02-16-13

## 2013-04-06 DIAGNOSIS — F419 Anxiety disorder, unspecified: Secondary | ICD-10-CM | POA: Insufficient documentation

## 2013-04-06 DIAGNOSIS — K4090 Unilateral inguinal hernia, without obstruction or gangrene, not specified as recurrent: Secondary | ICD-10-CM | POA: Insufficient documentation

## 2013-07-03 DIAGNOSIS — N509 Disorder of male genital organs, unspecified: Secondary | ICD-10-CM | POA: Diagnosis not present

## 2013-07-03 DIAGNOSIS — N50811 Right testicular pain: Secondary | ICD-10-CM | POA: Insufficient documentation

## 2013-07-29 DIAGNOSIS — F411 Generalized anxiety disorder: Secondary | ICD-10-CM | POA: Diagnosis not present

## 2013-08-03 DIAGNOSIS — N509 Disorder of male genital organs, unspecified: Secondary | ICD-10-CM | POA: Diagnosis not present

## 2013-08-05 DIAGNOSIS — N508 Other specified disorders of male genital organs: Secondary | ICD-10-CM | POA: Diagnosis not present

## 2013-08-05 DIAGNOSIS — N509 Disorder of male genital organs, unspecified: Secondary | ICD-10-CM | POA: Diagnosis not present

## 2013-09-18 ENCOUNTER — Encounter (INDEPENDENT_AMBULATORY_CARE_PROVIDER_SITE_OTHER): Payer: Self-pay

## 2013-09-18 ENCOUNTER — Ambulatory Visit (INDEPENDENT_AMBULATORY_CARE_PROVIDER_SITE_OTHER): Payer: Medicare Other | Admitting: Family Medicine

## 2013-09-18 ENCOUNTER — Encounter: Payer: Self-pay | Admitting: Family Medicine

## 2013-09-18 VITALS — BP 113/73 | HR 80 | Temp 97.3°F | Ht 73.0 in | Wt 180.0 lb

## 2013-09-18 DIAGNOSIS — J309 Allergic rhinitis, unspecified: Secondary | ICD-10-CM

## 2013-09-18 DIAGNOSIS — J069 Acute upper respiratory infection, unspecified: Secondary | ICD-10-CM

## 2013-09-18 DIAGNOSIS — R062 Wheezing: Secondary | ICD-10-CM | POA: Diagnosis not present

## 2013-09-18 MED ORDER — METHYLPREDNISOLONE ACETATE 40 MG/ML IJ SUSP
80.0000 mg | Freq: Once | INTRAMUSCULAR | Status: AC
  Administered 2013-09-18: 80 mg via INTRAMUSCULAR

## 2013-09-18 MED ORDER — AZITHROMYCIN 250 MG PO TABS: ORAL_TABLET | ORAL | Status: DC

## 2013-09-18 MED ORDER — LORATADINE 10 MG PO TABS: 10.0000 mg | ORAL_TABLET | Freq: Every day | ORAL | Status: DC

## 2013-09-18 NOTE — Progress Notes (Signed)
  Subjective:    Patient ID: Warren Miller, male    DOB: 1971-08-19, 42 y.o.   MRN: [id number]  HPI  URI Symptoms Onset: 1-2 months  Description: rhinorrhea, post nasal drip, cough, mild wheezing  Modifying factors:  Former smoker   Symptoms Nasal discharge: yes Fever: no Sore throat: mild Cough: yes Wheezing: faint  Ear pain: no GI symptoms: no Sick contacts: no  Red Flags  Stiff neck: no Dyspnea: no Rash: no Swallowing difficulty: no  Sinusitis Risk Factors Headache/face pain: no Double sickening: no tooth pain: no  Allergy Risk Factors Sneezing: yes Itchy scratchy throat: no Seasonal symptoms: yes  Flu Risk Factors Headache: no muscle aches: no severe fatigue: no     Review of Systems  All other systems reviewed and are negative.       Objective:   Physical Exam  Constitutional: He appears well-developed and well-nourished.  HENT:  Head: Normocephalic and atraumatic.  Right Ear: External ear normal.  Left Ear: External ear normal.  +nasal erythema, rhinorrhea bilaterally, + post oropharyngeal erythema    Eyes: Conjunctivae are normal. Pupils are equal, round, and reactive to light.  Neck: Normal range of motion. Neck supple.  Cardiovascular: Normal rate and regular rhythm.   Pulmonary/Chest: Effort normal.  Faint wheezes in apices    Abdominal: Soft.  Musculoskeletal: Normal range of motion.  Neurological: He is alert.  Skin: Skin is warm.          Assessment & Plan:  Wheezing - Plan: methylPREDNISolone acetate (DEPO-MEDROL) injection 80 mg  URI (upper respiratory infection) - Plan: azithromycin (ZITHROMAX) 250 MG tablet  Allergic rhinitis - Plan: loratadine (CLARITIN) tablet 10 mg  Likley overlapping URI, sinusitis, allergic rhinitis.  Depomedrol 80 mg IM x1 for wheezing  zpak for atypical sxs.  Start claritin Discussed general and infectious/ENT red flags.  Follow up as needed.

## 2013-10-02 DIAGNOSIS — J310 Chronic rhinitis: Secondary | ICD-10-CM | POA: Diagnosis not present

## 2013-10-02 DIAGNOSIS — J342 Deviated nasal septum: Secondary | ICD-10-CM | POA: Diagnosis not present

## 2013-10-20 DIAGNOSIS — F411 Generalized anxiety disorder: Secondary | ICD-10-CM | POA: Diagnosis not present

## 2013-11-02 DIAGNOSIS — J342 Deviated nasal septum: Secondary | ICD-10-CM | POA: Diagnosis not present

## 2013-11-02 DIAGNOSIS — J310 Chronic rhinitis: Secondary | ICD-10-CM | POA: Diagnosis not present

## 2013-12-29 DIAGNOSIS — Z23 Encounter for immunization: Secondary | ICD-10-CM | POA: Diagnosis not present

## 2013-12-29 DIAGNOSIS — J42 Unspecified chronic bronchitis: Secondary | ICD-10-CM | POA: Diagnosis not present

## 2013-12-29 DIAGNOSIS — G8929 Other chronic pain: Secondary | ICD-10-CM | POA: Diagnosis not present

## 2013-12-29 DIAGNOSIS — M5430 Sciatica, unspecified side: Secondary | ICD-10-CM | POA: Diagnosis not present

## 2013-12-29 DIAGNOSIS — Z79899 Other long term (current) drug therapy: Secondary | ICD-10-CM | POA: Diagnosis not present

## 2013-12-29 DIAGNOSIS — Z720 Tobacco use: Secondary | ICD-10-CM | POA: Insufficient documentation

## 2013-12-29 DIAGNOSIS — F17200 Nicotine dependence, unspecified, uncomplicated: Secondary | ICD-10-CM | POA: Diagnosis not present

## 2013-12-29 DIAGNOSIS — F411 Generalized anxiety disorder: Secondary | ICD-10-CM | POA: Diagnosis not present

## 2013-12-29 DIAGNOSIS — M545 Low back pain, unspecified: Secondary | ICD-10-CM | POA: Diagnosis not present

## 2014-01-19 DIAGNOSIS — F411 Generalized anxiety disorder: Secondary | ICD-10-CM | POA: Diagnosis not present

## 2014-03-10 ENCOUNTER — Emergency Department (HOSPITAL_COMMUNITY): Payer: Medicare Other

## 2014-03-10 ENCOUNTER — Emergency Department (HOSPITAL_COMMUNITY)
Admission: EM | Admit: 2014-03-10 | Discharge: 2014-03-10 | Disposition: A | Discharge status: Home / Self Care | Payer: Medicare Other | Attending: Emergency Medicine | Admitting: Emergency Medicine

## 2014-03-10 ENCOUNTER — Encounter (HOSPITAL_COMMUNITY): Payer: Self-pay | Admitting: Emergency Medicine

## 2014-03-10 ENCOUNTER — Telehealth: Payer: Self-pay | Admitting: Family Medicine

## 2014-03-10 DIAGNOSIS — K838 Other specified diseases of biliary tract: Secondary | ICD-10-CM | POA: Insufficient documentation

## 2014-03-10 DIAGNOSIS — R079 Chest pain, unspecified: Secondary | ICD-10-CM | POA: Diagnosis not present

## 2014-03-10 DIAGNOSIS — J449 Chronic obstructive pulmonary disease, unspecified: Secondary | ICD-10-CM | POA: Diagnosis not present

## 2014-03-10 DIAGNOSIS — Z79899 Other long term (current) drug therapy: Secondary | ICD-10-CM | POA: Insufficient documentation

## 2014-03-10 DIAGNOSIS — Z87891 Personal history of nicotine dependence: Secondary | ICD-10-CM | POA: Insufficient documentation

## 2014-03-10 DIAGNOSIS — R1011 Right upper quadrant pain: Secondary | ICD-10-CM

## 2014-03-10 DIAGNOSIS — Z9889 Other specified postprocedural states: Secondary | ICD-10-CM | POA: Insufficient documentation

## 2014-03-10 DIAGNOSIS — Z8619 Personal history of other infectious and parasitic diseases: Secondary | ICD-10-CM | POA: Insufficient documentation

## 2014-03-10 DIAGNOSIS — G8929 Other chronic pain: Secondary | ICD-10-CM | POA: Insufficient documentation

## 2014-03-10 DIAGNOSIS — K7689 Other specified diseases of liver: Secondary | ICD-10-CM | POA: Diagnosis not present

## 2014-03-10 HISTORY — DX: Unspecified abdominal pain: R10.9

## 2014-03-10 HISTORY — DX: Other chronic pain: G89.29

## 2014-03-10 LAB — COMPREHENSIVE METABOLIC PANEL
ALBUMIN: 3.9 g/dL (ref 3.5–5.2)
ALT: 22 U/L (ref 0–53)
AST: 27 U/L (ref 0–37)
Alkaline Phosphatase: 98 U/L (ref 39–117)
BUN: 11 mg/dL (ref 6–23)
CALCIUM: 9.4 mg/dL (ref 8.4–10.5)
CO2: 28 meq/L (ref 19–32)
Chloride: 103 mEq/L (ref 96–112)
Creatinine, Ser: 0.96 mg/dL (ref 0.50–1.35)
GFR calc Af Amer: >90 mL/min (ref >90)
Glucose, Bld: 100 mg/dL — ABNORMAL HIGH (ref 70–99)
Potassium: 3.8 mEq/L (ref 3.7–5.3)
SODIUM: 141 meq/L (ref 137–147)
Total Bilirubin: 0.3 mg/dL (ref 0.3–1.2)
Total Protein: 7.3 g/dL (ref 6.0–8.3)

## 2014-03-10 LAB — CBC WITH DIFFERENTIAL/PLATELET
BASOS ABS: 0 10*3/uL (ref 0.0–0.1)
BASOS PCT: 1 % (ref 0–1)
EOS ABS: 0.2 10*3/uL (ref 0.0–0.7)
Eosinophils Relative: 4 % (ref 0–5)
HCT: 43.8 % (ref 39.0–52.0)
Hemoglobin: 15.4 g/dL (ref 13.0–17.0)
Lymphocytes Relative: 28 % (ref 12–46)
Lymphs Abs: 1.3 10*3/uL (ref 0.7–4.0)
MCH: 30.4 pg (ref 26.0–34.0)
MCHC: 35.2 g/dL (ref 30.0–36.0)
MCV: 86.6 fL (ref 78.0–100.0)
MONO ABS: 0.4 10*3/uL (ref 0.1–1.0)
Monocytes Relative: 8 % (ref 3–12)
Neutro Abs: 2.8 10*3/uL (ref 1.7–7.7)
Neutrophils Relative %: 59 % (ref 43–77)
PLATELETS: 180 10*3/uL (ref 150–400)
RBC: 5.06 MIL/uL (ref 4.22–5.81)
RDW: 12.3 % (ref 11.5–15.5)
WBC: 4.6 10*3/uL (ref 4.0–10.5)

## 2014-03-10 LAB — D-DIMER, QUANTITATIVE (NOT AT ARMC): D-Dimer, Quant: <0.27 ug/mL-FEU (ref 0.00–0.48)

## 2014-03-10 LAB — URINALYSIS, ROUTINE W REFLEX MICROSCOPIC
BILIRUBIN URINE: NEGATIVE
Glucose, UA: NEGATIVE mg/dL
Hgb urine dipstick: NEGATIVE
Ketones, ur: NEGATIVE mg/dL
LEUKOCYTES UA: NEGATIVE
Nitrite: NEGATIVE
Protein, ur: NEGATIVE mg/dL
SPECIFIC GRAVITY, URINE: 1.01 (ref 1.005–1.030)
UROBILINOGEN UA: 0.2 mg/dL (ref 0.0–1.0)
pH: 5.5 (ref 5.0–8.0)

## 2014-03-10 LAB — LIPASE, BLOOD: Lipase: 28 U/L (ref 11–59)

## 2014-03-10 LAB — TROPONIN I

## 2014-03-10 MED ORDER — OMEPRAZOLE 20 MG PO CPDR: 20.0000 mg | DELAYED_RELEASE_CAPSULE | Freq: Every day | ORAL | Status: DC

## 2014-03-10 MED ORDER — IOHEXOL 300 MG/ML  SOLN
100.0000 mL | Freq: Once | INTRAMUSCULAR | Status: AC | PRN
  Administered 2014-03-10: 100 mL via INTRAVENOUS

## 2014-03-10 MED ORDER — IOHEXOL 300 MG/ML  SOLN
50.0000 mL | Freq: Once | INTRAMUSCULAR | Status: AC | PRN
  Administered 2014-03-10: 50 mL via ORAL

## 2014-03-10 MED ORDER — ONDANSETRON HCL 4 MG PO TABS: 4.0000 mg | ORAL_TABLET | Freq: Four times a day (QID) | ORAL | Status: DC

## 2014-03-10 NOTE — ED Notes (Signed)
nad noted prior to dc. Dc instructions reviewed and explained along with f/u appt. Voiced understanding. 2 scripts given to pt.

## 2014-03-10 NOTE — ED Notes (Signed)
Pt states RUQ pain since the age of 60. Pain has been intermittent but has recently worsened. States, "Feels like something is going up into my rib cage" States nausea and increased pain when he pushes on the area.

## 2014-03-10 NOTE — Telephone Encounter (Signed)
Patient is complaining with right side pain and states that when he pushes on the lower right side that it is causing severe pain and making him nauseated. Patient was advised that he needed to get straight to the ER for evaluation of appendicitis.

## 2014-03-10 NOTE — ED Provider Notes (Signed)
CSN: [id number]     Arrival date & time 03/10/14  1428 History  This chart was scribed for Warren Essex, MD by Ladene Artist, ED Scribe. The patient was seen in room APA06/APA06. Patient's care was started at 2:47 PM.    Chief Complaint  Patient presents with  . Abdominal Pain    The history is provided by the patient. No language interpreter was used.   HPI Comments: Warren Miller is a 43 y.o. male who presents to the Emergency Department complaining of intermittent R abdominal pain over the past 4 years that has worsened the last 2 days. Pt states that he feels like something is "pushing on his rib cage". He reports similar pain 4 years ago when he was diagnosed with H.pylori. Pt reports finishing medication for that years ago. He reports associated mild chest pain. Pain is eased with leaning to the side and pushing on the area. He denies nausea, vomiting, hematuria, back pain, testicular pain. No h/o abdominal surgery.  Past Medical History  Diagnosis Date  . Former heavy cigarette smoker (20-39 per day)   . H. pylori infection   . Chronic abdominal pain    Past Surgical History  Procedure Laterality Date  . Hernia repair     Family History  Problem Relation Age of Onset  . Liver disease Mother    History  Substance Use Topics  . Smoking status: Former Smoker    Types: Cigarettes    Quit date: 09/18/2004  . Smokeless tobacco: Current User    Types: Chew  . Alcohol Use: No    Review of Systems  Cardiovascular: Positive for chest pain.  Gastrointestinal: Positive for abdominal pain. Negative for nausea and vomiting.  Genitourinary: Negative for hematuria and testicular pain.  Musculoskeletal: Negative for back pain.  All other systems reviewed and are negative.   Allergies  Other and Tetracyclines & related  Home Medications   Prior to Admission medications   Medication Sig Start Date End Date Taking? Authorizing Provider  azithromycin (ZITHROMAX) 250 MG  tablet Take 2 tabs PO x 1 dose, then 1 tab PO QD x 4 days 09/18/13   Shanda Howells, MD  clonazePAM (KLONOPIN) 0.5 MG tablet Take 0.5 mg by mouth 5 (five) times daily.    Historical Provider, MD  ibuprofen (ADVIL,MOTRIN) 200 MG tablet Take 200 mg by mouth every 6 (six) hours as needed. For pain    Historical Provider, MD   Triage Vitals: BP 129/77  Pulse 83  Temp(Src) 98.7 F (37.1 C) (Oral)  Resp 16  Ht 6' (1.829 m)  Wt 177 lb (80.287 kg)  BMI 24.00 kg/m2  SpO2 100% Physical Exam  Nursing note and vitals reviewed. Constitutional: He is oriented to person, place, and time. He appears well-developed and well-nourished. No distress.  HENT:  Head: Normocephalic and atraumatic.  Eyes: EOM are normal.  Neck: Neck supple. No tracheal deviation present.  Cardiovascular: Normal rate.   Pulmonary/Chest: Effort normal. No respiratory distress.  Abdominal: There is no rebound and no guarding.  RUQ and epigastric tenderness No lower abdominal pain No CVA tenderness  Musculoskeletal: Normal range of motion.  Neurological: He is alert and oriented to person, place, and time.  Skin: Skin is warm and dry.  Psychiatric: He has a normal mood and affect. His behavior is normal.    ED Course  Procedures (including critical care time) DIAGNOSTIC STUDIES: Oxygen Saturation is 100% on RA, normal by my interpretation.    COORDINATION OF  CARE: 2:54 PM Discussed treatment plan with pt at bedside and pt agreed to plan. Labs Review Labs Reviewed  COMPREHENSIVE METABOLIC PANEL - Abnormal; Notable for the following:    Glucose, Bld 100 (*)    All other components within normal limits  CBC WITH DIFFERENTIAL  LIPASE, BLOOD  TROPONIN I  D-DIMER, QUANTITATIVE  URINALYSIS, ROUTINE W REFLEX MICROSCOPIC    Imaging Review Dg Chest 2 View  03/10/2014   CLINICAL DATA:  Right rib pain without injury.  EXAM: CHEST  2 VIEW  COMPARISON:  January 15, 2011.  FINDINGS: The heart size and mediastinal contours are  within normal limits. Both lungs are clear. Hyperexpansion of the lungs is noted consistent with chronic obstructive pulmonary disease. No pleural effusion or pneumothorax is noted. The visualized skeletal structures are unremarkable.  IMPRESSION: Findings consistent with chronic obstructive pulmonary disease. No acute cardiopulmonary abnormality seen.   Electronically Signed   By: Sabino Dick M.D.   On: 03/10/2014 15:33   Ct Abdomen Pelvis W Contrast  03/10/2014   CLINICAL DATA:  Right upper quadrant abdominal pain for 4 years  EXAM: CT ABDOMEN AND PELVIS WITH CONTRAST  TECHNIQUE: Multidetector CT imaging of the abdomen and pelvis was performed using the standard protocol following bolus administration of intravenous contrast.  CONTRAST:  42mL OMNIPAQUE IOHEXOL 300 MG/ML SOLN, 155mL OMNIPAQUE IOHEXOL 300 MG/ML SOLN  COMPARISON:  CT abdomen/ pelvis 06/15/2010  FINDINGS: Lung bases are clear.  Scattered too small characterize sub cm hepatic lesions are stable, statistically most likely cysts or hamartomas. An ingested pill is identified in the stomach image 15. Gallbladder, adrenal glands, kidneys, spleen, and pancreas are unremarkable. Circumaortic left renal vein noted. No free air, free fluid, or lymphadenopathy.  No bowel wall thickening or focal segmental dilatation. Normal appendix. No radiopaque renal, ureteral, or bladder calculus. Bladder physiologically distended but otherwise unremarkable. No acute osseous finding.  IMPRESSION: No acute intra-abdominal or pelvic pathology.   Electronically Signed   By: Conchita Paris M.D.   On: 03/10/2014 17:00   US Abdomen Limited Ruq  03/10/2014   CLINICAL DATA:  RUQ pain  EXAM: US ABDOMEN LIMITED - RIGHT UPPER QUADRANT  COMPARISON:  US ABDOMEN COMPLETE dated 07/10/2010  FINDINGS: Gallbladder:  No gallstones or wall thickening visualized measuring 1.6 mm. Positive sonographic Murphy's sign.  Common bile duct:  Diameter: 6.9 mm (previous diameter 7.3 mm)  Liver:   No focal lesion identified. Within normal limits in parenchymal echogenicity.  IMPRESSION: Persistent dilation of the common bile duct though decreased compared to previous study. A positive sonographic Murphy's sign was elicited. These findings are equivocal. Clinical correlation recommended and possibly GI consultation. The ductal dilatation may reflect passage or prior gallbladder calculus.  1 cm cyst described within the liver on CT is is not visualized.   Electronically Signed   By: Margaree Mackintosh M.D.   On: 03/10/2014 16:04     EKG Interpretation None      MDM   Final diagnoses:  RUQ pain  Common bile duct dilation   Intermittent RUQ x4 years, worse in the past several days.  Similar to H pylori infection in the past.   No peritoneal signs. LFTs and lipase unremarkabable Dilated common bile duct of 7 mm similar to previous. No gallstones. Chest x-ray and d-dimer negative.  This was discussed with Dr. Laural Golden. He agrees this appears to be a chronic issue. Recommend continuing PPI and followup with Dr. Buford Dresser in the office. Will also be referred  to surgery.    Date: 03/10/2014  Rate: 76  Rhythm: normal sinus rhythm  QRS Axis: normal  Intervals: normal  ST/T Wave abnormalities: normal  Conduction Disutrbances:none  Narrative Interpretation:   Old EKG Reviewed: none available   I personally performed the services described in this documentation, which was scribed in my presence. The recorded information has been reviewed and is accurate.    Warren Essex, MD 03/10/14 2206

## 2014-03-10 NOTE — Discharge Instructions (Signed)
Abdominal Pain, Adult Follow up with Dr. Gala Romney this week. Take the stomach medications as prescribed.  Return to the ED if you develop new or worsening symptoms. Many things can cause abdominal pain. Usually, abdominal pain is not caused by a disease and will improve without treatment. It can often be observed and treated at home. Your health care provider will do a physical exam and possibly order blood tests and X-rays to help determine the seriousness of your pain. However, in many cases, more time must pass before a clear cause of the pain can be found. Before that point, your health care provider may not know if you need more testing or further treatment. HOME CARE INSTRUCTIONS  Monitor your abdominal pain for any changes. The following actions may help to alleviate any discomfort you are experiencing:  Only take over-the-counter or prescription medicines as directed by your health care provider.  Do not take laxatives unless directed to do so by your health care provider.  Try a clear liquid diet (broth, tea, or water) as directed by your health care provider. Slowly move to a bland diet as tolerated. SEEK MEDICAL CARE IF:  You have unexplained abdominal pain.  You have abdominal pain associated with nausea or diarrhea.  You have pain when you urinate or have a bowel movement.  You experience abdominal pain that wakes you in the night.  You have abdominal pain that is worsened or improved by eating food.  You have abdominal pain that is worsened with eating fatty foods. SEEK IMMEDIATE MEDICAL CARE IF:   Your pain does not go away within 2 hours.  You have a fever.  You keep throwing up (vomiting).  Your pain is felt only in portions of the abdomen, such as the right side or the left lower portion of the abdomen.  You pass bloody or black tarry stools. MAKE SURE YOU:  Understand these instructions.   Will watch your condition.   Will get help right away if you are not  doing well or get worse.  Document Released: 07/25/2005 Document Revised: 08/05/2013 Document Reviewed: 06/24/2013 [facility] Patient Information 2014 [place].

## 2014-03-10 NOTE — ED Notes (Signed)
Tolerated drink without difficulty.

## 2014-03-11 ENCOUNTER — Telehealth: Payer: Self-pay

## 2014-03-11 NOTE — Telephone Encounter (Signed)
Note made in pts chart

## 2014-03-11 NOTE — Telephone Encounter (Signed)
Recent communications review; for the record, please note that this gentleman is not in [facility] patient

## 2014-03-11 NOTE — Telephone Encounter (Signed)
NUR called this morning wanting [place] to see this patient soon since he was in there ER last night. I made him an appointment on Monday to see AS. Then the patient called stating that he did not want to see RMR and only wanted NUR. He said he called NUR's office and left a message with them for an appointment. He has not been seen here in the office before only had a EGD/TCS in 2011 with RMR.

## 2014-03-15 ENCOUNTER — Ambulatory Visit (INDEPENDENT_AMBULATORY_CARE_PROVIDER_SITE_OTHER): Payer: Medicare Other | Admitting: Internal Medicine

## 2014-03-15 ENCOUNTER — Encounter (INDEPENDENT_AMBULATORY_CARE_PROVIDER_SITE_OTHER): Payer: Self-pay | Admitting: Internal Medicine

## 2014-03-15 ENCOUNTER — Ambulatory Visit: Payer: Medicare Other | Admitting: Gastroenterology

## 2014-03-15 VITALS — BP 110/52 | HR 72 | Temp 97.9°F | Ht 72.0 in | Wt 173.7 lb

## 2014-03-15 DIAGNOSIS — R1011 Right upper quadrant pain: Secondary | ICD-10-CM | POA: Diagnosis not present

## 2014-03-15 DIAGNOSIS — G8929 Other chronic pain: Secondary | ICD-10-CM

## 2014-03-15 DIAGNOSIS — R198 Other specified symptoms and signs involving the digestive system and abdomen: Secondary | ICD-10-CM | POA: Insufficient documentation

## 2014-03-15 LAB — HEPATIC FUNCTION PANEL
ALT: 19 U/L (ref 0–53)
AST: 20 U/L (ref 0–37)
Albumin: 4.2 g/dL (ref 3.5–5.2)
Alkaline Phosphatase: 90 U/L (ref 39–117)
Bilirubin, Direct: 0.1 mg/dL (ref 0.0–0.3)
Indirect Bilirubin: 0.5 mg/dL (ref 0.2–1.2)
TOTAL PROTEIN: 6.9 g/dL (ref 6.0–8.3)
Total Bilirubin: 0.6 mg/dL (ref 0.2–1.2)

## 2014-03-15 NOTE — Patient Instructions (Signed)
Hepatic function today. If pain reoccurs please call our office.

## 2014-03-15 NOTE — Progress Notes (Signed)
Subjective:     Patient ID: Warren Miller, male   DOB: 1971/04/26, 43 y.o.   MRN: [id number]  HPI Presents today as a new patient. Recently seen in the ED this month for rt upper quadrant pain. [place] was essentially normal. LFTs were normal. He tells me he had rt sided abdominal pain radiating up into his chest. He also c/o that he had rt sided abdominal pain Saturday but resolved. There is no pain now.  He underwent a HIDA scan in 2011 for this pain and this was normal. He has had this pain off an on for about 4 yrs.  Appetite is good. No weight loss. Occasionally has acid reflux. No abdominal pain today. Usually has a BM about once a day.    03/10/2014: US abdomen:  IMPRESSION:  Persistent dilation of the common bile duct though decreased  compared to previous study. A positive sonographic Murphy's sign was  elicited. These findings are equivocal. Clinical correlation   recommended and possibly GI consultation. The ductal dilatation may  reflect passage or prior gallbladder calculus.  1 cm cyst described within the liver on CT is is not visualized.  CMP     Component Value Date/Time   NA 141 03/10/2014 1445   K 3.8 03/10/2014 1445   CL 103 03/10/2014 1445   CO2 28 03/10/2014 1445   GLUCOSE 100* 03/10/2014 1445   BUN 11 03/10/2014 1445   CREATININE 0.96 03/10/2014 1445   CALCIUM 9.4 03/10/2014 1445   PROT 7.3 03/10/2014 1445   ALBUMIN 3.9 03/10/2014 1445   AST 27 03/10/2014 1445   ALT 22 03/10/2014 1445   ALKPHOS 98 03/10/2014 1445   BILITOT 0.3 03/10/2014 1445   GFRNONAA >90 03/10/2014 1445   GFRAA >90 03/10/2014 1445   CBC    Component Value Date/Time   WBC 4.6 03/10/2014 1445   RBC 5.06 03/10/2014 1445   HGB 15.4 03/10/2014 1445   HCT 43.8 03/10/2014 1445   PLT 180 03/10/2014 1445   MCV 86.6 03/10/2014 1445   MCH 30.4 03/10/2014 1445   MCHC 35.2 03/10/2014 1445   RDW 12.3 03/10/2014 1445   LYMPHSABS 1.3 03/10/2014 1445   MONOABS 0.4 03/10/2014 1445   EOSABS 0.2 03/10/2014 1445   BASOSABS 0.0  03/10/2014 1445      03/10/2014 CT abdomen/pelvis with CM: 03/10/2014 CLINICAL DATA: RUQ pain EXAM: US ABDOMEN LIMITED - RIGHT UPPER QUADRANT COMPARISON: US ABDOMEN COMPLETE dated 07/10/2010 FINDINGS: Gallbladder: No gallstones or wall thickening visualized measuring 1.6 mm. Positive sonographic Murphy's sign. Common bile duct: Diameter: 6.9 mm (previous diameter 7.3 mm) Liver: No focal lesion identified. Within normal limits in parenchymal echogenicity. IMPRESSION: Persistent dilation of the common bile duct though decreased compared to previous study. A positive sonographic Murphy's sign was elicited. These findings are equivocal. Clinical correlation recommended and possibly GI consultation. The ductal dilatation may reflect passage or prior gallbladder calculus. 1 cm cyst described within the liver on CT is is not visualized. Electronically Signed By: Margaree Mackintosh M.D. On: 03/10/2014 16:04   07/07/2010 EGD/Colonoscopy: Dr. Gala Romney: EGD: Normal esophagus. Small hiatal hernia, otherwise normal D1 thru D2.   1. Colon, polyp(s), between sigmoid/descending : TUBULAR ADENOMA.NO HIGH GRADE DYSPLASIA OR MALIGNANCY IDENTIFIED (ONE FRAGMENT).  2. Colon, polyp(s), sigmoid : TUBULAR ADENOMA.NO HIGH GRADE DYSPLASIA OR MALIGNANCY IDENTIFIED (ONE FRAGMENT).  07/11/2010 HIDA scan: The gallbladder ejection fraction is 75.3% which is normal.  The patient did not experience symptoms during CCK infusion.  IMPRESSION:  Normal gallbladder  ejection fraction.  There is no evidence of cystic duct or common duct obstruction.  Provider: Valentino Hue    Review of Systems see hpi Past Medical History  Diagnosis Date  . Former heavy cigarette smoker (20-39 per day)   . H. pylori infection   . Chronic abdominal pain     Past Surgical History  Procedure Laterality Date  . Hernia repair      x 2    Allergies  Allergen Reactions  . Other Shortness Of Breath    Patient states he had a reaction (shortness of  breath) after receiving an antibiotic for a sinus infection in the past. Name of medication unknown. Patient can take AMOXICILLIN.  Marland Kitchen Tetracyclines & Related     Patient thinks this is medication that he is allergic to    Current Outpatient Prescriptions on File Prior to Visit  Medication Sig Dispense Refill  . clonazePAM (KLONOPIN) 0.5 MG tablet Take 0.5 mg by mouth 3 (three) times daily.       . fluticasone (FLONASE) 50 MCG/ACT nasal spray Place 2 sprays into both nostrils 2 (two) times daily as needed.      Marland Kitchen omeprazole (PRILOSEC) 20 MG capsule Take 1 capsule (20 mg total) by mouth daily.  30 capsule  0   No current facility-administered medications on file prior to visit.         Objective:   Physical Exam  Filed Vitals:   03/15/14 1023  BP: 110/52  Pulse: 72  Temp: 97.9 F (36.6 C)  Height: 6' (1.829 m)  Weight: 173 lb 11.2 oz (78.79 kg)  Alert and oriented. Skin warm and dry. Oral mucosa is moist.   . Sclera anicteric, conjunctivae is pink. Thyroid not enlarged. No cervical lymphadenopathy. Lungs clear. Heart regular rate and rhythm.  Abdomen is soft. Bowel sounds are positive. No hepatomegaly. No abdominal masses felt. No tenderness.  No edema to lower extremities.        Assessment:    Rt upper pain which has resolved. ? CBC stone, which he may have passed.    Plan:     If pain reoccurs, will repeat a Hepatic function.  Hepatic function.

## 2014-04-12 DIAGNOSIS — F411 Generalized anxiety disorder: Secondary | ICD-10-CM | POA: Diagnosis not present

## 2014-05-16 DIAGNOSIS — R079 Chest pain, unspecified: Secondary | ICD-10-CM | POA: Diagnosis not present

## 2014-05-16 DIAGNOSIS — Z87891 Personal history of nicotine dependence: Secondary | ICD-10-CM | POA: Diagnosis not present

## 2014-05-16 DIAGNOSIS — R0789 Other chest pain: Secondary | ICD-10-CM | POA: Diagnosis not present

## 2014-05-16 DIAGNOSIS — R52 Pain, unspecified: Secondary | ICD-10-CM | POA: Diagnosis not present

## 2014-05-16 DIAGNOSIS — F411 Generalized anxiety disorder: Secondary | ICD-10-CM | POA: Diagnosis not present

## 2014-05-16 DIAGNOSIS — R072 Precordial pain: Secondary | ICD-10-CM | POA: Diagnosis not present

## 2014-05-16 DIAGNOSIS — F410 Panic disorder [episodic paroxysmal anxiety] without agoraphobia: Secondary | ICD-10-CM | POA: Diagnosis not present

## 2014-05-16 DIAGNOSIS — F329 Major depressive disorder, single episode, unspecified: Secondary | ICD-10-CM | POA: Diagnosis not present

## 2014-05-16 DIAGNOSIS — Z79899 Other long term (current) drug therapy: Secondary | ICD-10-CM | POA: Diagnosis not present

## 2014-05-16 DIAGNOSIS — F3289 Other specified depressive episodes: Secondary | ICD-10-CM | POA: Diagnosis not present

## 2014-06-06 DIAGNOSIS — F410 Panic disorder [episodic paroxysmal anxiety] without agoraphobia: Secondary | ICD-10-CM | POA: Diagnosis not present

## 2014-06-06 DIAGNOSIS — R42 Dizziness and giddiness: Secondary | ICD-10-CM | POA: Diagnosis not present

## 2014-06-06 DIAGNOSIS — IMO0002 Reserved for concepts with insufficient information to code with codable children: Secondary | ICD-10-CM | POA: Diagnosis not present

## 2014-06-06 DIAGNOSIS — F411 Generalized anxiety disorder: Secondary | ICD-10-CM | POA: Diagnosis not present

## 2014-06-06 DIAGNOSIS — F3289 Other specified depressive episodes: Secondary | ICD-10-CM | POA: Diagnosis not present

## 2014-06-06 DIAGNOSIS — Z79899 Other long term (current) drug therapy: Secondary | ICD-10-CM | POA: Diagnosis not present

## 2014-06-06 DIAGNOSIS — Z87891 Personal history of nicotine dependence: Secondary | ICD-10-CM | POA: Diagnosis not present

## 2014-06-06 DIAGNOSIS — F329 Major depressive disorder, single episode, unspecified: Secondary | ICD-10-CM | POA: Diagnosis not present

## 2014-07-02 ENCOUNTER — Ambulatory Visit (INDEPENDENT_AMBULATORY_CARE_PROVIDER_SITE_OTHER): Payer: Medicare Other | Admitting: Family Medicine

## 2014-07-02 ENCOUNTER — Encounter: Payer: Self-pay | Admitting: Family Medicine

## 2014-07-02 VITALS — BP 105/72 | HR 65 | Temp 96.7°F | Ht 72.0 in | Wt 173.8 lb

## 2014-07-02 DIAGNOSIS — R5381 Other malaise: Secondary | ICD-10-CM

## 2014-07-02 DIAGNOSIS — Z0000 Encounter for general adult medical examination without abnormal findings: Secondary | ICD-10-CM

## 2014-07-02 DIAGNOSIS — K589 Irritable bowel syndrome without diarrhea: Secondary | ICD-10-CM

## 2014-07-02 DIAGNOSIS — E785 Hyperlipidemia, unspecified: Secondary | ICD-10-CM | POA: Diagnosis not present

## 2014-07-02 DIAGNOSIS — J4489 Other specified chronic obstructive pulmonary disease: Secondary | ICD-10-CM | POA: Diagnosis not present

## 2014-07-02 DIAGNOSIS — J449 Chronic obstructive pulmonary disease, unspecified: Secondary | ICD-10-CM

## 2014-07-02 DIAGNOSIS — R350 Frequency of micturition: Secondary | ICD-10-CM | POA: Diagnosis not present

## 2014-07-02 DIAGNOSIS — R0602 Shortness of breath: Secondary | ICD-10-CM

## 2014-07-02 DIAGNOSIS — R5383 Other fatigue: Secondary | ICD-10-CM | POA: Diagnosis not present

## 2014-07-02 DIAGNOSIS — K219 Gastro-esophageal reflux disease without esophagitis: Secondary | ICD-10-CM | POA: Diagnosis not present

## 2014-07-02 DIAGNOSIS — IMO0001 Reserved for inherently not codable concepts without codable children: Secondary | ICD-10-CM

## 2014-07-02 LAB — POCT CBC
Granulocyte percent: 61.5 %G (ref 37–80)
HCT, POC: 47.5 % (ref 43.5–53.7)
Hemoglobin: 16.1 g/dL (ref 14.1–18.1)
Lymph, poc: 1.5 (ref 0.6–3.4)
MCH, POC: 29.6 pg (ref 27–31.2)
MCHC: 33.9 g/dL (ref 31.8–35.4)
MCV: 87.3 fL (ref 80–97)
MPV: 8.6 fL (ref 0–99.8)
POC Granulocyte: 2.5 (ref 2–6.9)
POC LYMPH PERCENT: 35.5 %L (ref 10–50)
Platelet Count, POC: 159 10*3/uL (ref 142–424)
RBC: 5.5 M/uL (ref 4.69–6.13)
RDW, POC: 12.8 %
WBC: 4.1 10*3/uL — AB (ref 4.6–10.2)

## 2014-07-02 MED ORDER — LEVALBUTEROL HCL 1.25 MG/3ML IN NEBU
1.2500 mg | INHALATION_SOLUTION | RESPIRATORY_TRACT | Status: AC
  Administered 2014-07-02: 1.25 mg via RESPIRATORY_TRACT

## 2014-07-02 MED ORDER — PREDNISONE 10 MG PO TABS: ORAL_TABLET | ORAL | Status: DC

## 2014-07-02 MED ORDER — AMOXICILLIN 875 MG PO TABS: 875.0000 mg | ORAL_TABLET | Freq: Two times a day (BID) | ORAL | Status: DC

## 2014-07-02 MED ORDER — LEVALBUTEROL HCL 1.25 MG/0.5ML IN NEBU: 1.2500 mg | INHALATION_SOLUTION | Freq: Once | RESPIRATORY_TRACT | Status: DC

## 2014-07-02 MED ORDER — BACLOFEN 10 MG PO TABS: 10.0000 mg | ORAL_TABLET | Freq: Three times a day (TID) | ORAL | Status: DC

## 2014-07-02 MED ORDER — OMEPRAZOLE 20 MG PO CPDR: 20.0000 mg | DELAYED_RELEASE_CAPSULE | Freq: Every day | ORAL | Status: DC

## 2014-07-02 MED ORDER — BUDESONIDE-FORMOTEROL FUMARATE 160-4.5 MCG/ACT IN AERO: 2.0000 | INHALATION_SPRAY | Freq: Two times a day (BID) | RESPIRATORY_TRACT | Status: DC

## 2014-07-02 NOTE — Progress Notes (Signed)
   Subjective:    Patient ID: Warren Miller, male    DOB: 11/21/1970, 43 y.o.   MRN: [id number]  HPI  This 43 y.o. male presents for evaluation of SOB.  He states he has been told he has COPD.  He has hx of psychiatric illness and sees [facility] psychiatry.  He has been having cough and it is productive. He feels tight in his chest.  He has had CXR that show copd changes.  He has been having some GERD sx's.  He c/o bowel spasms and right upper abdominal discomfort.  He denies fever.  He has  C/o fatigue.  Review of Systems C/o GERD, abdominal pain, SOB, anxiety, and GERD   No chest pain,  HA, dizziness, vision change, N/V, diarrhea, constipation, dysuria, urinary urgency or frequency, myalgias, arthralgias or rash.  Objective:   Physical Exam  Vital signs noted  Well developed well nourished male.  HEENT - Head atraumatic Normocephalic                Eyes - PERRLA, Conjuctiva - clear Sclera- Clear EOMI                Ears - EAC's Wnl TM's Wnl Gross Hearing WNL                Nose - Nares patent                 Throat - oropharanx wnl with poor dentition Respiratory - Lungs with diminished breath sounds throughout Cardiac - RRR S1 and S2 without murmur GI - Abdomen soft Nontender and bowel sounds active x 4 Extremities - No edema. Neuro - Grossly intact.  Post neb -Breath sounds CTA a/p with good air movement. FVC- 117% Fev1 - 78% Fef 25-75 - 96% PEF - 107%     Assessment & Plan:  Routine general medical examination at a health care facility - Plan: POCT CBC, CMP14+EGFR, Lipid panel, PSA, total and free, TSH  IBS (irritable bowel syndrome) - Plan: baclofen (LIORESAL) 10 MG tablet  SOB (shortness of breath) - Plan: POCT CBC, CMP14+EGFR, Lipid panel, PSA, total and free, TSH, PR BREATHING CAPACITY TEST, levalbuterol (XOPENEX) nebulizer solution 1.25 mg  Gastroesophageal reflux disease without esophagitis - Plan: omeprazole (PRILOSEC) 20 MG capsule  Other malaise and fatigue -  Plan: POCT CBC, CMP14+EGFR, TSH  Urine frequency - Plan: PSA, total and free  Other and unspecified hyperlipidemia - Plan: Lipid panel  Follow up in 2 weeks.  [place]

## 2014-07-03 LAB — TSH: TSH: 2.02 u[IU]/mL (ref 0.450–4.500)

## 2014-07-03 LAB — CMP14+EGFR
ALT: 15 IU/L (ref 0–44)
AST: 22 IU/L (ref 0–40)
Albumin/Globulin Ratio: 1.7 (ref 1.1–2.5)
Albumin: 4.4 g/dL (ref 3.5–5.5)
Alkaline Phosphatase: 82 IU/L (ref 39–117)
BUN/Creatinine Ratio: 10 (ref 9–20)
BUN: 10 mg/dL (ref 6–24)
CO2: 22 mmol/L (ref 18–29)
Calcium: 9.4 mg/dL (ref 8.7–10.2)
Chloride: 101 mmol/L (ref 97–108)
Creatinine, Ser: 1.02 mg/dL (ref 0.76–1.27)
GFR calc Af Amer: 102 mL/min/{1.73_m2} (ref >59)
GFR calc non Af Amer: 88 mL/min/{1.73_m2} (ref >59)
Globulin, Total: 2.6 g/dL (ref 1.5–4.5)
Glucose: 94 mg/dL (ref 65–99)
Potassium: 4.1 mmol/L (ref 3.5–5.2)
Sodium: 140 mmol/L (ref 134–144)
Total Bilirubin: 0.6 mg/dL (ref 0.0–1.2)
Total Protein: 7 g/dL (ref 6.0–8.5)

## 2014-07-03 LAB — LIPID PANEL
Chol/HDL Ratio: 3.6 ratio units (ref 0.0–5.0)
Cholesterol, Total: 170 mg/dL (ref 100–199)
HDL: 47 mg/dL (ref >39)
LDL Calculated: 111 mg/dL — ABNORMAL HIGH (ref 0–99)
Triglycerides: 59 mg/dL (ref 0–149)
VLDL Cholesterol Cal: 12 mg/dL (ref 5–40)

## 2014-07-03 LAB — PSA, TOTAL AND FREE
PSA, Free Pct: 40 %
PSA, Free: 0.16 ng/mL
PSA: 0.4 ng/mL (ref 0.0–4.0)

## 2014-07-07 ENCOUNTER — Telehealth: Payer: Self-pay | Admitting: Family Medicine

## 2014-07-07 DIAGNOSIS — F411 Generalized anxiety disorder: Secondary | ICD-10-CM | POA: Diagnosis not present

## 2014-07-07 NOTE — Telephone Encounter (Signed)
Message copied by Waverly Ferrari on Wed Jul 07, 2014 12:19 PM ------      Message from: Lysbeth Penner      Created: Tue Jul 06, 2014  1:52 PM       Labs look good, kidney and liver function wnl and psa normal.  Lipids ok ------

## 2014-07-19 ENCOUNTER — Ambulatory Visit (INDEPENDENT_AMBULATORY_CARE_PROVIDER_SITE_OTHER): Payer: Medicare Other | Admitting: Family Medicine

## 2014-07-19 ENCOUNTER — Encounter: Payer: Self-pay | Admitting: Family Medicine

## 2014-07-19 VITALS — BP 106/70 | HR 81 | Temp 96.9°F | Ht 72.0 in | Wt 176.8 lb

## 2014-07-19 DIAGNOSIS — J441 Chronic obstructive pulmonary disease with (acute) exacerbation: Secondary | ICD-10-CM

## 2014-07-19 MED ORDER — ALBUTEROL SULFATE HFA 108 (90 BASE) MCG/ACT IN AERS: 2.0000 | INHALATION_SPRAY | Freq: Four times a day (QID) | RESPIRATORY_TRACT | Status: DC | PRN

## 2014-07-19 NOTE — Progress Notes (Signed)
   Subjective:    Patient ID: Warren Miller, male    DOB: Mar 20, 1971, 43 y.o.   MRN: [id number]  HPI  This 43 y.o. male presents for evaluation of follow up on shortness of breath.  He smoked heavily for 17 years and quit.  He has been having SOB and difficulty with his breathing for some time and he states he has been tx'd with spiriva.  He has used SABA in the past.  He was put on symbicort and he is here for follow up.  He has been breathing a little easier.  He states since he was tx'd for H-pylori he has had difficulty with his breathing. He has hx of psychiatric illness and anxiety and gets SOB.  His PFT was normal.  Review of Systems C/o sob   No chest pain, HA, dizziness, vision change, N/V, diarrhea, constipation, dysuria, urinary urgency or frequency, myalgias, arthralgias or rash.  Objective:   Physical Exam  Vital signs noted  Well developed well nourished male.  HEENT - Head atraumatic Normocephalic                Eyes - PERRLA, Conjuctiva - clear Sclera- Clear EOMI                Ears - EAC's Wnl TM's Wnl Gross Hearing WNL                Nose - Nares patent                 Throat - oropharanx wnl Respiratory - Lungs CTA bilateral Cardiac - RRR S1 and S2 without murmur GI - Abdomen soft Nontender and bowel sounds active x 4 Extremities - No edema. Neuro - Grossly intact.      Assessment & Plan:  COPD exacerbation - Plan: albuterol (PROVENTIL HFA;VENTOLIN HFA) 108 (90 BASE) MCG/ACT inhaler.  He states he is breathing better since using the symbicort for 2 weeks. Discussed using SABA with the symbicort prn.  Follow up in 6 months.  Lysbeth Penner FNP

## 2014-09-28 DIAGNOSIS — J42 Unspecified chronic bronchitis: Secondary | ICD-10-CM | POA: Diagnosis not present

## 2014-09-28 DIAGNOSIS — M5440 Lumbago with sciatica, unspecified side: Secondary | ICD-10-CM | POA: Diagnosis not present

## 2014-09-28 DIAGNOSIS — F17220 Nicotine dependence, chewing tobacco, uncomplicated: Secondary | ICD-10-CM | POA: Diagnosis not present

## 2014-09-29 DIAGNOSIS — F419 Anxiety disorder, unspecified: Secondary | ICD-10-CM | POA: Diagnosis not present

## 2014-10-01 IMAGING — US US ABDOMEN LIMITED
1 series · 14 of 25 positions shown · non-contrast
Comparison: US ABDOMEN COMPLETE dated 07/10/2010

CLINICAL DATA: RUQ pain

EXAM:
US ABDOMEN LIMITED - RIGHT UPPER QUADRANT

[Series 1: us abdomen limited · 0.20mm/px · 14 of 52 slices shown]
[im 1/52]
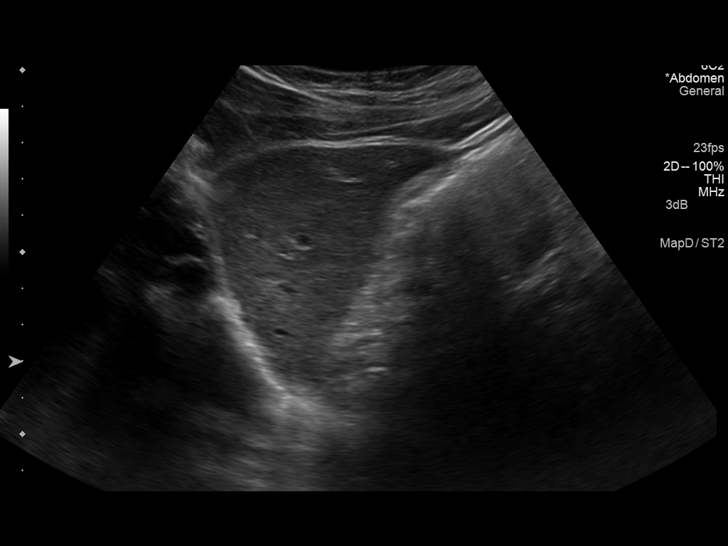
[im 5/52]
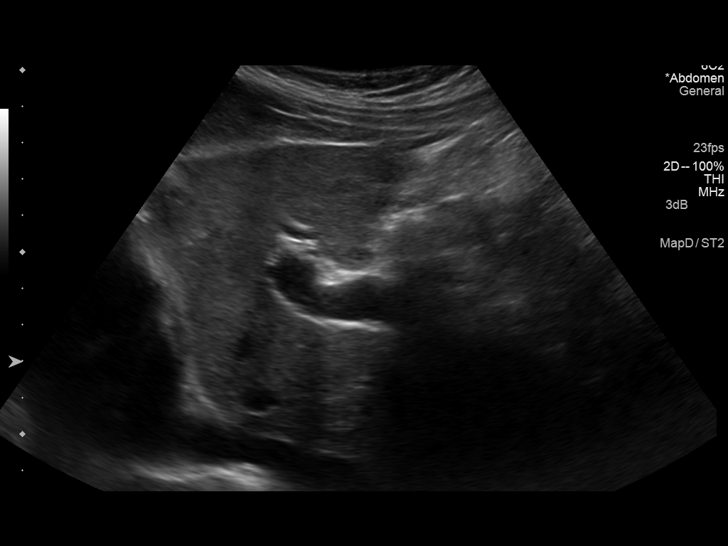
[im 9/52]
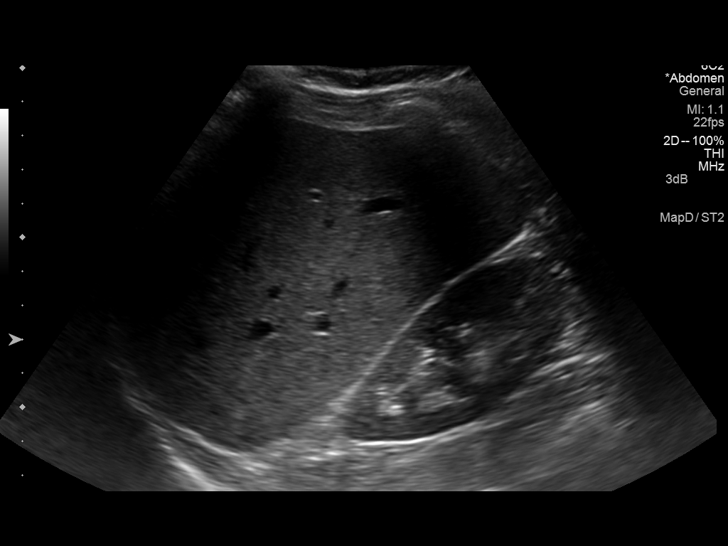
[im 13/52]
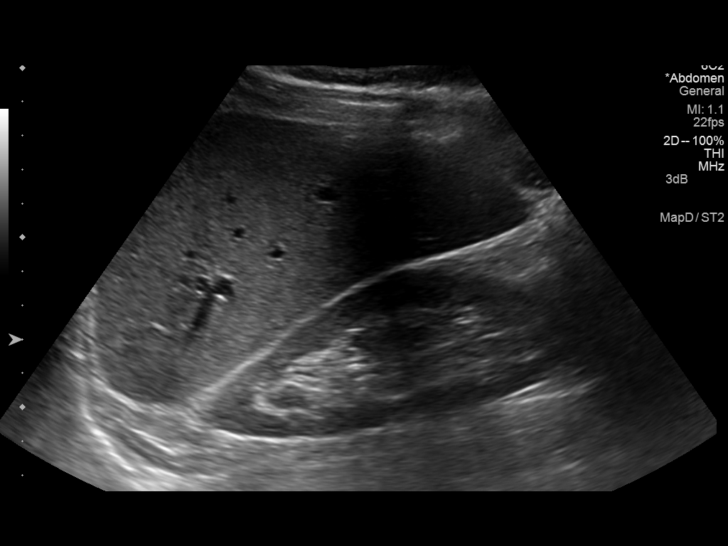
[im 18/52]
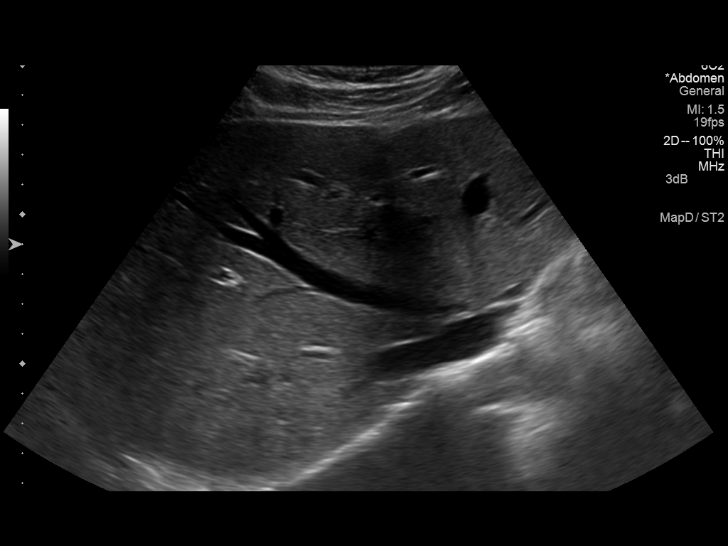
[im 20/52]
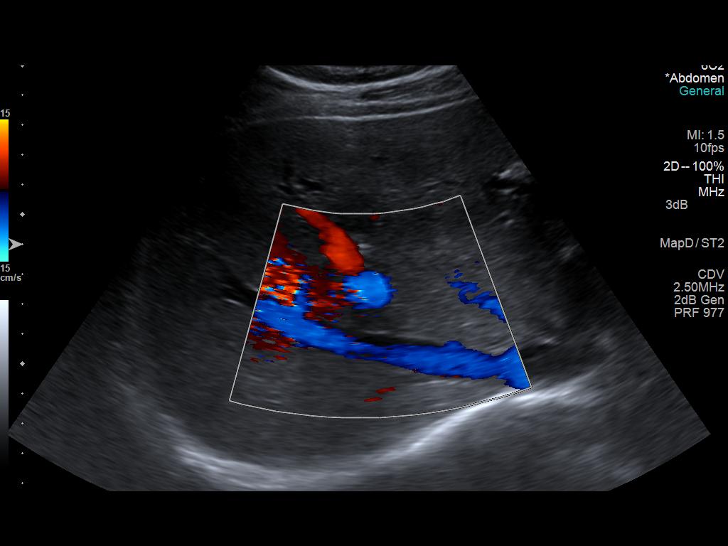
[im 24/52]
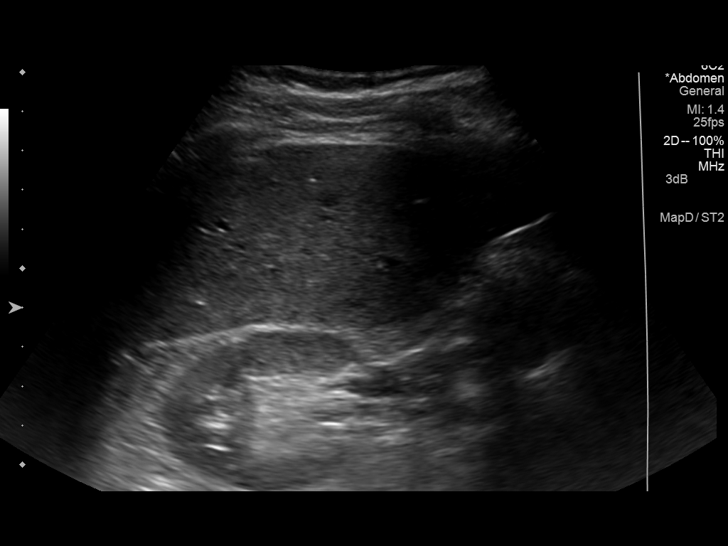
[im 28/52]
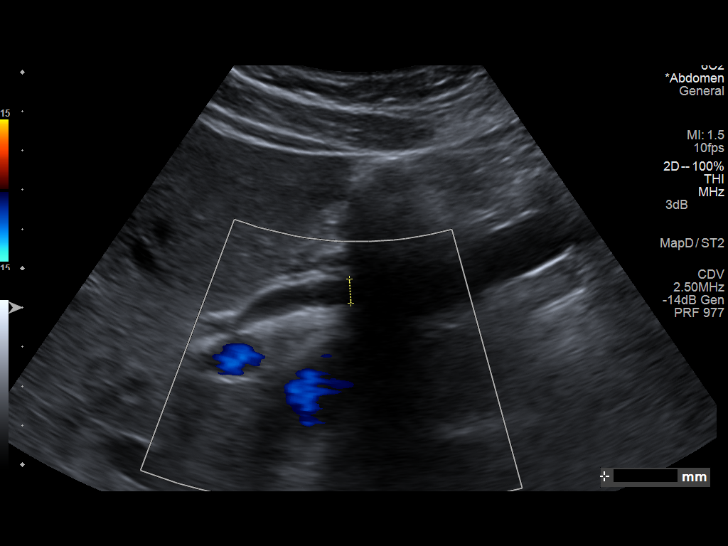
[im 32/52]
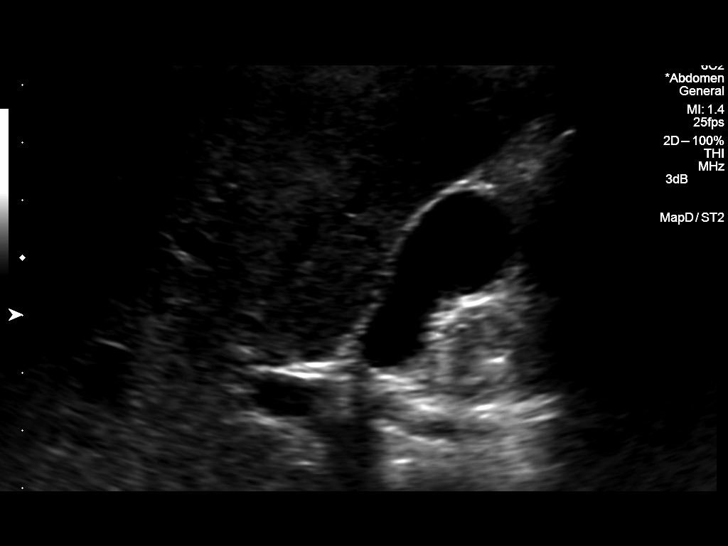
[im 35/52]
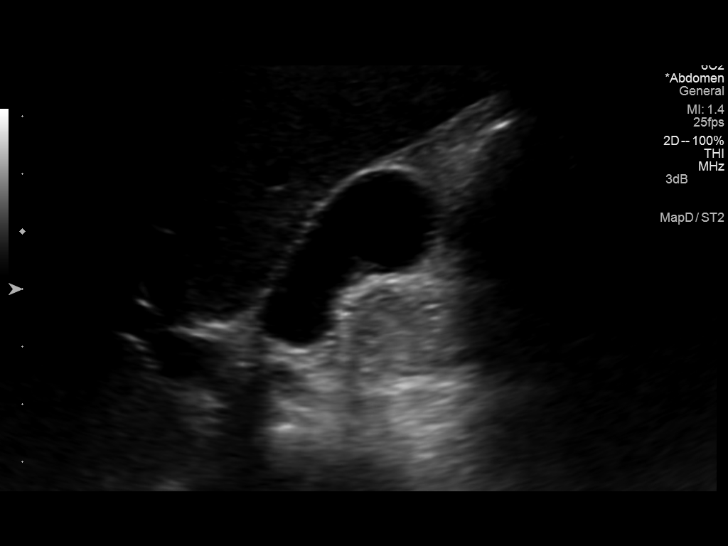
[im 39/52]
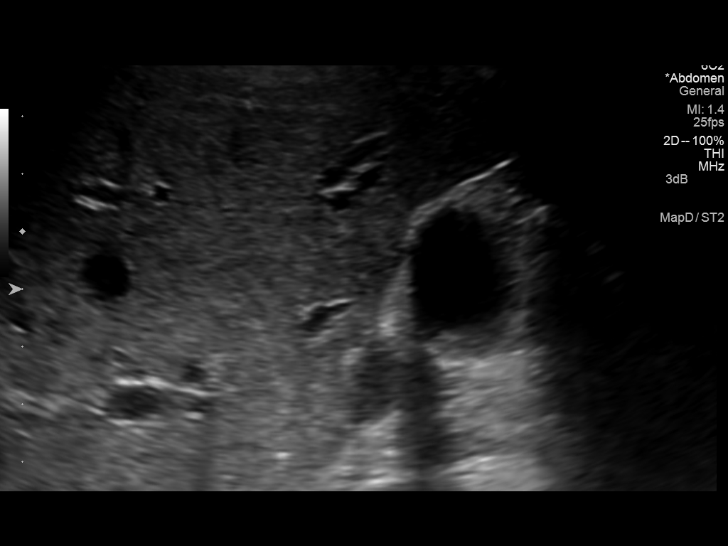
[im 43/52]
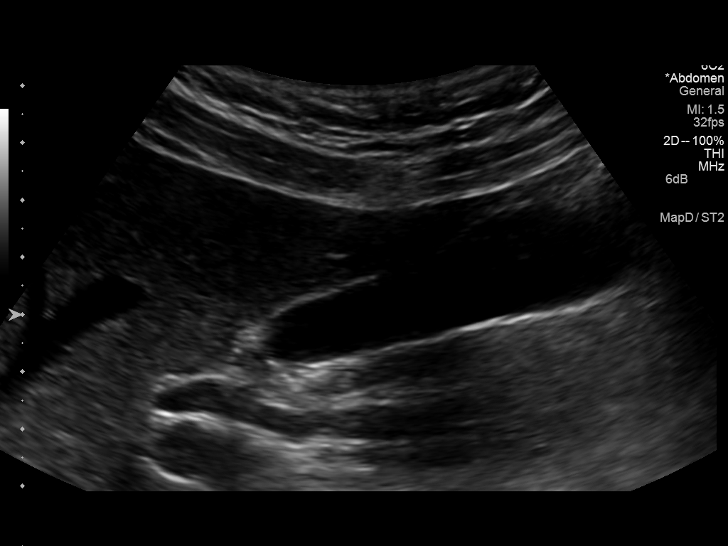
[im 47/52]
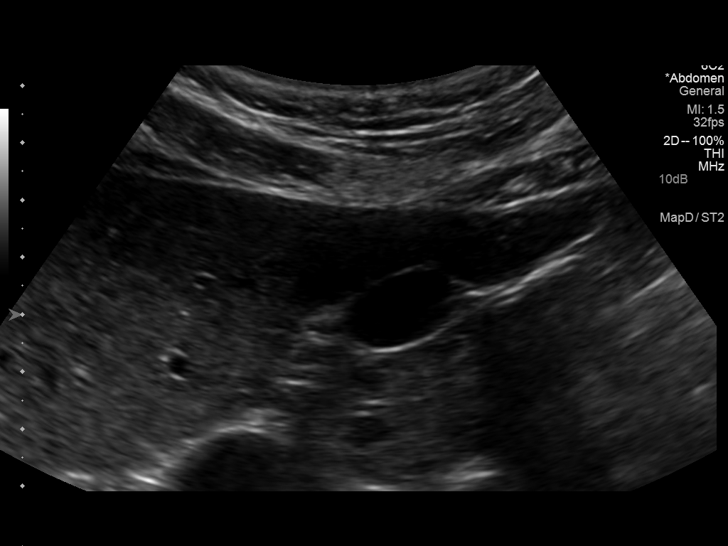
[im 52/52]
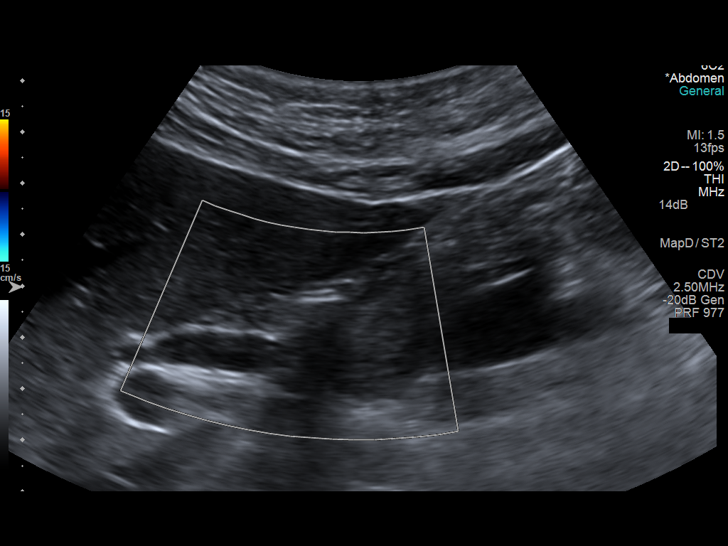

[14 of 25 positions shown; findings below may reference images not displayed]

FINDINGS: Gallbladder:

No gallstones or wall thickening visualized measuring 1.6 mm.
Positive sonographic Murphy's sign.

Common bile duct:

Diameter: 6.9 mm (previous diameter 7.3 mm)

Liver:

No focal lesion identified. Within normal limits in parenchymal
echogenicity.
IMPRESSION: Persistent dilation of the common bile duct though decreased
compared to previous study. A positive sonographic Murphy's sign was
elicited. These findings are equivocal. Clinical correlation
recommended and possibly GI consultation. The ductal dilatation may
reflect passage or prior gallbladder calculus.

1 cm cyst described within the liver on CT is is not visualized.

## 2014-10-18 ENCOUNTER — Ambulatory Visit: Payer: Medicare Other | Attending: *Deleted | Admitting: Physical Therapy

## 2014-10-18 DIAGNOSIS — M545 Low back pain: Secondary | ICD-10-CM | POA: Insufficient documentation

## 2014-10-26 ENCOUNTER — Ambulatory Visit: Payer: Medicare Other | Admitting: *Deleted

## 2014-10-26 DIAGNOSIS — M545 Low back pain: Secondary | ICD-10-CM | POA: Diagnosis not present

## 2014-11-02 ENCOUNTER — Ambulatory Visit: Payer: Medicare Other | Attending: *Deleted | Admitting: Physical Therapy

## 2014-11-02 DIAGNOSIS — M545 Low back pain: Secondary | ICD-10-CM | POA: Insufficient documentation

## 2014-11-09 ENCOUNTER — Ambulatory Visit: Payer: Medicare Other | Admitting: Physical Therapy

## 2014-11-09 DIAGNOSIS — M545 Low back pain: Secondary | ICD-10-CM | POA: Diagnosis not present

## 2014-11-16 ENCOUNTER — Ambulatory Visit: Payer: Medicare Other | Admitting: Physical Therapy

## 2014-11-16 DIAGNOSIS — M545 Low back pain: Secondary | ICD-10-CM | POA: Diagnosis not present

## 2014-11-23 ENCOUNTER — Ambulatory Visit: Payer: Medicare Other | Admitting: Physical Therapy

## 2014-11-23 DIAGNOSIS — M545 Low back pain: Secondary | ICD-10-CM | POA: Diagnosis not present

## 2014-11-25 DIAGNOSIS — F419 Anxiety disorder, unspecified: Secondary | ICD-10-CM | POA: Diagnosis not present

## 2014-11-30 ENCOUNTER — Ambulatory Visit: Payer: Medicare Other | Attending: *Deleted | Admitting: *Deleted

## 2014-11-30 DIAGNOSIS — M545 Low back pain: Secondary | ICD-10-CM | POA: Insufficient documentation

## 2014-12-07 ENCOUNTER — Ambulatory Visit: Payer: Medicare Other | Admitting: *Deleted

## 2014-12-07 DIAGNOSIS — M545 Low back pain: Secondary | ICD-10-CM | POA: Diagnosis not present

## 2014-12-14 ENCOUNTER — Encounter: Payer: Medicare Other | Admitting: Physical Therapy

## 2014-12-21 ENCOUNTER — Ambulatory Visit: Payer: Medicare Other | Admitting: *Deleted

## 2015-02-17 DIAGNOSIS — F419 Anxiety disorder, unspecified: Secondary | ICD-10-CM | POA: Diagnosis not present

## 2015-03-09 ENCOUNTER — Encounter: Payer: Self-pay | Admitting: Family

## 2015-03-09 ENCOUNTER — Ambulatory Visit (INDEPENDENT_AMBULATORY_CARE_PROVIDER_SITE_OTHER): Payer: Medicare Other | Admitting: Family

## 2015-03-09 VITALS — BP 111/72 | HR 82 | Temp 97.6°F | Ht 72.0 in | Wt 180.4 lb

## 2015-03-09 DIAGNOSIS — M545 Low back pain: Secondary | ICD-10-CM | POA: Diagnosis not present

## 2015-03-09 MED ORDER — CYCLOBENZAPRINE HCL 10 MG PO TABS: 10.0000 mg | ORAL_TABLET | Freq: Three times a day (TID) | ORAL | Status: DC | PRN

## 2015-03-09 MED ORDER — MELOXICAM 15 MG PO TABS: 15.0000 mg | ORAL_TABLET | Freq: Every day | ORAL | Status: DC

## 2015-03-09 MED ORDER — METHYLPREDNISOLONE ACETATE 80 MG/ML IJ SUSP
80.0000 mg | Freq: Once | INTRAMUSCULAR | Status: AC
  Administered 2015-03-09: 80 mg via INTRAMUSCULAR

## 2015-03-09 MED ORDER — KETOROLAC TROMETHAMINE 60 MG/2ML IM SOLN
60.0000 mg | Freq: Once | INTRAMUSCULAR | Status: AC
  Administered 2015-03-09: 60 mg via INTRAMUSCULAR

## 2015-03-09 NOTE — Patient Instructions (Signed)
Back Pain, Adult Low back pain is very common. About 1 in 5 people have back pain.The cause of low back pain is rarely dangerous. The pain often gets better over time.About half of people with a sudden onset of back pain feel better in just 2 weeks. About 8 in 10 people feel better by 6 weeks.  CAUSES Some common causes of back pain include:  Strain of the muscles or ligaments supporting the spine.  Wear and tear (degeneration) of the spinal discs.  Arthritis.  Direct injury to the back. DIAGNOSIS Most of the time, the direct cause of low back pain is not known.However, back pain can be treated effectively even when the exact cause of the pain is unknown.Answering your caregiver's questions about your overall health and symptoms is one of the most accurate ways to make sure the cause of your pain is not dangerous. If your caregiver needs more information, he or she may order lab work or imaging tests (X-rays or MRIs).However, even if imaging tests show changes in your back, this usually does not require surgery. HOME CARE INSTRUCTIONS For many people, back pain returns.Since low back pain is rarely dangerous, it is often a condition that people can learn to manageon their own.   Remain active. It is stressful on the back to sit or stand in one place. Do not sit, drive, or stand in one place for more than 30 minutes at a time. Take short walks on level surfaces as soon as pain allows.Try to increase the length of time you walk each day.  Do not stay in bed.Resting more than 1 or 2 days can delay your recovery.  Do not avoid exercise or work.Your body is made to move.It is not dangerous to be active, even though your back may hurt.Your back will likely heal faster if you return to being active before your pain is gone.  Pay attention to your body when you bend and lift. Many people have less discomfortwhen lifting if they bend their knees, keep the load close to their bodies,and  avoid twisting. Often, the most comfortable positions are those that put less stress on your recovering back.  Find a comfortable position to sleep. Use a firm mattress and lie on your side with your knees slightly bent. If you lie on your back, put a pillow under your knees.  Only take over-the-counter or prescription medicines as directed by your caregiver. Over-the-counter medicines to reduce pain and inflammation are often the most helpful.Your caregiver may prescribe muscle relaxant drugs.These medicines help dull your pain so you can more quickly return to your normal activities and healthy exercise.  Put ice on the injured area.  Put ice in a plastic bag.  Place a towel between your skin and the bag.  Leave the ice on for 15-20 minutes, 03-04 times a day for the first 2 to 3 days. After that, ice and heat may be alternated to reduce pain and spasms.  Ask your caregiver about trying back exercises and gentle massage. This may be of some benefit.  Avoid feeling anxious or stressed.Stress increases muscle tension and can worsen back pain.It is important to recognize when you are anxious or stressed and learn ways to manage it.Exercise is a great option. SEEK MEDICAL CARE IF:  You have pain that is not relieved with rest or medicine.  You have pain that does not improve in 1 week.  You have new symptoms.  You are generally not feeling well. SEEK   IMMEDIATE MEDICAL CARE IF:   You have pain that radiates from your back into your legs.  You develop new bowel or bladder control problems.  You have unusual weakness or numbness in your arms or legs.  You develop nausea or vomiting.  You develop abdominal pain.  You feel faint. Document Released: 10/15/2005 Document Revised: 04/15/2012 Document Reviewed: 02/16/2014 Exit[employer] Patient Information 2015 [employer]. This information is not intended to replace advice given to you by your health care provider. Make sure you  discuss any questions you have with your health care provider.  

## 2015-03-09 NOTE — Progress Notes (Signed)
   Subjective:    Patient ID: Warren Miller, male    DOB: 1971-04-06, 44 y.o.   MRN: [id number]  Back Pain This is a chronic problem. The current episode started more than 1 year ago. The problem occurs constantly. The problem is unchanged. The pain is present in the gluteal. The quality of the pain is described as aching. The pain radiates to the right thigh. The pain is at a severity of 4/10. The pain is moderate. The symptoms are aggravated by standing and twisting. Associated symptoms include leg pain and weakness. Pertinent negatives include no bladder incontinence, bowel incontinence, dysuria, numbness or tingling. He has tried NSAIDs and ice for the symptoms. The treatment provided mild relief.      Review of Systems  HENT: Negative.   Respiratory: Negative.   Cardiovascular: Negative.   Gastrointestinal: Negative.  Negative for bowel incontinence.  Endocrine: Negative.   Genitourinary: Negative.  Negative for bladder incontinence and dysuria.  Musculoskeletal: Positive for back pain.  Neurological: Positive for weakness. Negative for tingling and numbness.  Hematological: Negative.   Psychiatric/Behavioral: Negative.   All other systems reviewed and are negative.      Objective:   Physical Exam  Constitutional: He is oriented to person, place, and time. He appears well-developed and well-nourished. No distress.  HENT:  Head: Normocephalic.  Eyes: Pupils are equal, round, and reactive to light. Right eye exhibits no discharge. Left eye exhibits no discharge.  Neck: Normal range of motion. Neck supple. No thyromegaly present.  Cardiovascular: Normal rate, regular rhythm, normal heart sounds and intact distal pulses.   No murmur heard. Pulmonary/Chest: Effort normal and breath sounds normal. No respiratory distress. He has no wheezes.  Abdominal: Soft. Bowel sounds are normal. He exhibits no distension. There is no tenderness.  Musculoskeletal: Normal range of motion. He  exhibits no edema or tenderness.  Neurological: He is alert and oriented to person, place, and time. He has normal reflexes. No cranial nerve deficit.  Skin: Skin is warm and dry. No rash noted. No erythema.  Psychiatric: He has a normal mood and affect. His behavior is normal. Judgment and thought content normal.  Vitals reviewed.     BP 111/72 mmHg  Pulse 82  Temp(Src) 97.6 F (36.4 C) (Oral)  Ht 6' (1.829 m)  Wt 180 lb 6.4 oz (81.829 kg)  BMI 24.46 kg/m2     Assessment & Plan:  1. Midline low back pain, with sciatica presence unspecified -Rest -Ice as needed -RTO prn - ketorolac (TORADOL) injection 60 mg; Inject 2 mLs (60 mg total) into the muscle once. - methylPREDNISolone acetate (DEPO-MEDROL) injection 80 mg; Inject 1 mL (80 mg total) into the muscle once. - BMP8+EGFR - cyclobenzaprine (FLEXERIL) 10 MG tablet; Take 1 tablet (10 mg total) by mouth 3 (three) times daily as needed for muscle spasms.  Dispense: 30 tablet; Refill: 0 - meloxicam (MOBIC) 15 MG tablet; Take 1 tablet (15 mg total) by mouth daily.  Dispense: 30 tablet; Refill: 0 - Ambulatory referral to [place], FNP

## 2015-03-10 ENCOUNTER — Telehealth: Payer: Self-pay | Admitting: *Deleted

## 2015-03-10 ENCOUNTER — Telehealth: Payer: Self-pay | Admitting: Family Medicine

## 2015-03-10 LAB — BMP8+EGFR
BUN / CREAT RATIO: 10 (ref 9–20)
BUN: 10 mg/dL (ref 6–24)
CALCIUM: 9.2 mg/dL (ref 8.7–10.2)
CHLORIDE: 101 mmol/L (ref 97–108)
CO2: 24 mmol/L (ref 18–29)
CREATININE: 1.04 mg/dL (ref 0.76–1.27)
GFR calc Af Amer: 99 mL/min/{1.73_m2} (ref >59)
GFR calc non Af Amer: 86 mL/min/{1.73_m2} (ref >59)
Glucose: 101 mg/dL — ABNORMAL HIGH (ref 65–99)
POTASSIUM: 4.5 mmol/L (ref 3.5–5.2)
SODIUM: 140 mmol/L (ref 134–144)

## 2015-03-10 NOTE — Telephone Encounter (Signed)
-----   Message from Sharion Balloon, [place] sent at 03/10/2015 10:27 AM EDT ----- Kidney function stable

## 2015-03-11 NOTE — Telephone Encounter (Signed)
Patient aware of labs and thought that he was getting an hpylori test also

## 2015-03-14 NOTE — Telephone Encounter (Signed)
Pt was seen for back pain. Pt was told to make chronic follow up to have full blood work drawn.

## 2015-03-14 NOTE — Telephone Encounter (Signed)
Detailed message left for patient.

## 2015-03-21 ENCOUNTER — Ambulatory Visit: Payer: Medicare Other | Admitting: Family

## 2015-03-21 DIAGNOSIS — M5442 Lumbago with sciatica, left side: Secondary | ICD-10-CM | POA: Diagnosis not present

## 2015-03-21 DIAGNOSIS — M5441 Lumbago with sciatica, right side: Secondary | ICD-10-CM | POA: Diagnosis not present

## 2015-03-23 ENCOUNTER — Encounter: Payer: Self-pay | Admitting: Family

## 2015-03-23 ENCOUNTER — Ambulatory Visit (INDEPENDENT_AMBULATORY_CARE_PROVIDER_SITE_OTHER): Payer: Medicare Other | Admitting: Family

## 2015-03-23 VITALS — BP 131/81 | HR 89 | Temp 97.4°F | Ht 72.0 in | Wt 181.8 lb

## 2015-03-23 DIAGNOSIS — M5441 Lumbago with sciatica, right side: Secondary | ICD-10-CM | POA: Diagnosis not present

## 2015-03-23 MED ORDER — METHYLPREDNISOLONE ACETATE 80 MG/ML IJ SUSP
80.0000 mg | Freq: Once | INTRAMUSCULAR | Status: AC
  Administered 2015-03-23: 80 mg via INTRAMUSCULAR

## 2015-03-23 MED ORDER — KETOROLAC TROMETHAMINE 60 MG/2ML IM SOLN
60.0000 mg | Freq: Once | INTRAMUSCULAR | Status: AC
  Administered 2015-03-23: 60 mg via INTRAMUSCULAR

## 2015-03-23 MED ORDER — TRAMADOL HCL 50 MG PO TABS: 50.0000 mg | ORAL_TABLET | Freq: Three times a day (TID) | ORAL | Status: DC | PRN

## 2015-03-23 NOTE — Patient Instructions (Signed)
Sciatica Sciatica is pain, weakness, numbness, or tingling along the path of the sciatic nerve. The nerve starts in the lower back and runs down the back of each leg. The nerve controls the muscles in the lower leg and in the back of the knee, while also providing sensation to the back of the thigh, lower leg, and the sole of your foot. Sciatica is a symptom of another medical condition. For instance, nerve damage or certain conditions, such as a herniated disk or bone spur on the spine, pinch or put pressure on the sciatic nerve. This causes the pain, weakness, or other sensations normally associated with sciatica. Generally, sciatica only affects one side of the body. CAUSES   Herniated or slipped disc.  Degenerative disk disease.  A pain disorder involving the narrow muscle in the buttocks (piriformis syndrome).  Pelvic injury or fracture.  Pregnancy.  Tumor (rare). SYMPTOMS  Symptoms can vary from mild to very severe. The symptoms usually travel from the low back to the buttocks and down the back of the leg. Symptoms can include:  Mild tingling or dull aches in the lower back, leg, or hip.  Numbness in the back of the calf or sole of the foot.  Burning sensations in the lower back, leg, or hip.  Sharp pains in the lower back, leg, or hip.  Leg weakness.  Severe back pain inhibiting movement. These symptoms may get worse with coughing, sneezing, laughing, or prolonged sitting or standing. Also, being overweight may worsen symptoms. DIAGNOSIS  Your caregiver will perform a physical exam to look for common symptoms of sciatica. He or she may ask you to do certain movements or activities that would trigger sciatic nerve pain. Other tests may be performed to find the cause of the sciatica. These may include:  Blood tests.  X-rays.  Imaging tests, such as an MRI or CT scan. TREATMENT  Treatment is directed at the cause of the sciatic pain. Sometimes, treatment is not necessary  and the pain and discomfort goes away on its own. If treatment is needed, your caregiver may suggest:  Over-the-counter medicines to relieve pain.  Prescription medicines, such as anti-inflammatory medicine, muscle relaxants, or narcotics.  Applying heat or ice to the painful area.  Steroid injections to lessen pain, irritation, and inflammation around the nerve.  Reducing activity during periods of pain.  Exercising and stretching to strengthen your abdomen and improve flexibility of your spine. Your caregiver may suggest losing weight if the extra weight makes the back pain worse.  Physical therapy.  Surgery to eliminate what is pressing or pinching the nerve, such as a bone spur or part of a herniated disk. HOME CARE INSTRUCTIONS   Only take over-the-counter or prescription medicines for pain or discomfort as directed by your caregiver.  Apply ice to the affected area for 20 minutes, 3-4 times a day for the first 48-72 hours. Then try heat in the same way.  Exercise, stretch, or perform your usual activities if these do not aggravate your pain.  Attend physical therapy sessions as directed by your caregiver.  Keep all follow-up appointments as directed by your caregiver.  Do not wear high heels or shoes that do not provide proper support.  Check your mattress to see if it is too soft. A firm mattress may lessen your pain and discomfort. SEEK IMMEDIATE MEDICAL CARE IF:   You lose control of your bowel or bladder (incontinence).  You have increasing weakness in the lower back, pelvis, buttocks,   or legs.  You have redness or swelling of your back.  You have a burning sensation when you urinate.  You have pain that gets worse when you lie down or awakens you at night.  Your pain is worse than you have experienced in the past.  Your pain is lasting longer than 4 weeks.  You are suddenly losing weight without reason. MAKE SURE YOU:  Understand these  instructions.  Will watch your condition.  Will get help right away if you are not doing well or get worse. Document Released: 10/09/2001 Document Revised: 04/15/2012 Document Reviewed: 02/24/2012 [employer] Patient Information 2015 [employer]. This information is not intended to replace advice given to you by your health care provider. Make sure you discuss any questions you have with your health care provider.  

## 2015-03-23 NOTE — Progress Notes (Signed)
   Subjective:    Patient ID: Warren Miller, male    DOB: 29-Jul-1971, 44 y.o.   MRN: [id number]  Pt presents to the office to recheck back pain: Back Pain This is a chronic problem. The problem occurs constantly. The problem has been waxing and waning since onset. The pain is present in the lumbar spine and gluteal. The quality of the pain is described as aching. The pain radiates to the right thigh. The pain is at a severity of 5/10. The pain is moderate. Associated symptoms include headaches, leg pain and tingling. Pertinent negatives include no bladder incontinence, bowel incontinence, chest pain, dysuria or paresthesias. He has tried muscle relaxant and NSAIDs for the symptoms. The treatment provided mild relief.      Review of Systems  Constitutional: Negative.   Respiratory: Negative.   Cardiovascular: Negative.  Negative for chest pain.  Gastrointestinal: Negative.  Negative for bowel incontinence.  Endocrine: Negative.   Genitourinary: Negative.  Negative for bladder incontinence and dysuria.  Musculoskeletal: Positive for back pain.  Neurological: Positive for tingling and headaches. Negative for paresthesias.  Hematological: Negative.   Psychiatric/Behavioral: Negative.   All other systems reviewed and are negative.      Objective:   Physical Exam  Constitutional: He is oriented to person, place, and time. He appears well-developed and well-nourished. No distress.  HENT:  Head: Normocephalic.  Right Ear: External ear normal.  Left Ear: External ear normal.  Nose: Nose normal.  Mouth/Throat: Oropharynx is clear and moist.  Eyes: Pupils are equal, round, and reactive to light. Right eye exhibits no discharge. Left eye exhibits no discharge.  Neck: Normal range of motion. Neck supple. No thyromegaly present.  Cardiovascular: Normal rate, regular rhythm, normal heart sounds and intact distal pulses.   No murmur heard. Pulmonary/Chest: Effort normal and breath sounds  normal. No respiratory distress. He has no wheezes.  Abdominal: Soft. Bowel sounds are normal. He exhibits no distension. There is no tenderness.  Musculoskeletal: Normal range of motion. He exhibits no edema or tenderness.  Neurological: He is alert and oriented to person, place, and time. He has normal reflexes. No cranial nerve deficit.  Skin: Skin is warm and dry. No rash noted. No erythema.  Psychiatric: He has a normal mood and affect. His behavior is normal. Judgment and thought content normal.  Vitals reviewed.   BP 131/81 mmHg  Pulse 89  Temp(Src) 97.4 F (36.3 C) (Oral)  Ht 6' (1.829 m)  Wt 181 lb 12.8 oz (82.464 kg)  BMI 24.65 kg/m2       Assessment & Plan:  1. Bilateral low back pain with right-sided sciatica -Rest -Ice -Keep Ortho appointments- Pt states he is scheduled for MRI in June -RTO prn - methylPREDNISolone acetate (DEPO-MEDROL) injection 80 mg; Inject 1 mL (80 mg total) into the muscle once. - ketorolac (TORADOL) injection 60 mg; Inject 2 mLs (60 mg total) into the muscle once. - traMADol (ULTRAM) 50 MG tablet; Take 1 tablet (50 mg total) by mouth every 8 (eight) hours as needed.  Dispense: 30 tablet; Refill: 0  Evelina Dun, FNP

## 2015-03-30 DIAGNOSIS — M5441 Lumbago with sciatica, right side: Secondary | ICD-10-CM | POA: Diagnosis not present

## 2015-03-30 DIAGNOSIS — M5442 Lumbago with sciatica, left side: Secondary | ICD-10-CM | POA: Diagnosis not present

## 2015-03-30 DIAGNOSIS — G8929 Other chronic pain: Secondary | ICD-10-CM | POA: Diagnosis not present

## 2015-04-04 DIAGNOSIS — G894 Chronic pain syndrome: Secondary | ICD-10-CM | POA: Diagnosis not present

## 2015-04-04 DIAGNOSIS — M5442 Lumbago with sciatica, left side: Secondary | ICD-10-CM | POA: Diagnosis not present

## 2015-05-12 ENCOUNTER — Encounter: Payer: Self-pay | Admitting: *Deleted

## 2015-05-16 ENCOUNTER — Encounter: Payer: Self-pay | Admitting: *Deleted

## 2015-05-16 ENCOUNTER — Ambulatory Visit: Payer: Medicare Other | Admitting: Diagnostic Neuroimaging

## 2015-05-19 ENCOUNTER — Other Ambulatory Visit: Payer: Self-pay | Admitting: Family

## 2015-05-19 DIAGNOSIS — F419 Anxiety disorder, unspecified: Secondary | ICD-10-CM | POA: Diagnosis not present

## 2015-06-01 ENCOUNTER — Ambulatory Visit (INDEPENDENT_AMBULATORY_CARE_PROVIDER_SITE_OTHER): Payer: Medicare Other | Admitting: Diagnostic Neuroimaging

## 2015-06-01 ENCOUNTER — Encounter: Payer: Self-pay | Admitting: Diagnostic Neuroimaging

## 2015-06-01 VITALS — BP 131/78 | HR 89 | Ht 72.0 in | Wt 178.4 lb

## 2015-06-01 DIAGNOSIS — I499 Cardiac arrhythmia, unspecified: Secondary | ICD-10-CM | POA: Diagnosis not present

## 2015-06-01 DIAGNOSIS — M5441 Lumbago with sciatica, right side: Secondary | ICD-10-CM

## 2015-06-01 DIAGNOSIS — R200 Anesthesia of skin: Secondary | ICD-10-CM | POA: Diagnosis not present

## 2015-06-01 DIAGNOSIS — R292 Abnormal reflex: Secondary | ICD-10-CM | POA: Diagnosis not present

## 2015-06-01 DIAGNOSIS — R29898 Other symptoms and signs involving the musculoskeletal system: Secondary | ICD-10-CM | POA: Diagnosis not present

## 2015-06-01 DIAGNOSIS — R448 Other symptoms and signs involving general sensations and perceptions: Secondary | ICD-10-CM

## 2015-06-01 DIAGNOSIS — R202 Paresthesia of skin: Secondary | ICD-10-CM

## 2015-06-01 DIAGNOSIS — Z79899 Other long term (current) drug therapy: Secondary | ICD-10-CM | POA: Diagnosis not present

## 2015-06-01 NOTE — Progress Notes (Signed)
[facility]  PATIENT: Warren Miller DOB: 12/28/1970  REFERRING CLINICIAN: Ramos HISTORY FROM: patient  REASON FOR VISIT: new consult    HISTORICAL  CHIEF COMPLAINT:  Chief Complaint  Patient presents with  . Numbness    rm 7 , New Patient  . Leg weakness    right leg    HISTORY OF PRESENT ILLNESS:   44 year old right-handed male here for evaluation of right leg weakness.  2011 patient fell at work, twisted his right leg, had sharp shooting pain in his back radiating to the right leg. Since that time he has had persistent low back pain and right leg problems. Recently he went to pain management/orthopedic clinic, had repeat MRI of the lumbar spine which showed mild degenerative changes, but not enough to significantly explain patient's degree of right leg pain and right leg weakness. Patient referred to me for further evaluation.  No significant problems with the left leg. No problems with his left arm. He has some intermittent right arm numbness. He has some mild neck pain.  On exam today patient noted to have some irregularly irregular heart rhythm. Patient reports family history of atrial fibrillation in his father and first cousin.   REVIEW OF SYSTEMS: Full 14 system review of systems performed and notable only for weight loss fatigue chest pain blurred vision turns of breath cough wheezing feeling hot and pain runny nose frequent infection anxiety not asleep disinterest in activities restless legs confusion headache weakness.  ALLERGIES: Allergies  Allergen Reactions  . Other Shortness Of Breath    Patient states he had a reaction (shortness of breath) after receiving an antibiotic for a H-pylori  infection in the past. Name of medication unknown. Patient can take AMOXICILLIN.  Marland Kitchen Tetracyclines & Related     Patient thinks this is medication that he is allergic to    HOME MEDICATIONS: Outpatient Prescriptions Prior to Visit  Medication Sig  Dispense Refill  . albuterol (PROVENTIL HFA;VENTOLIN HFA) 108 (90 BASE) MCG/ACT inhaler Inhale 2 puffs into the lungs every 6 (six) hours as needed for wheezing or shortness of breath. 1 Inhaler 11  . budesonide-formoterol (SYMBICORT) 160-4.5 MCG/ACT inhaler Inhale 2 puffs into the lungs 2 (two) times daily. 1 Inhaler 11  . cyclobenzaprine (FLEXERIL) 10 MG tablet TAKE ONE TABLET BY MOUTH THREE TIMES DAILY AS NEEDED FOR MUSCLE SPASMS 30 tablet 1  . diazepam (VALIUM) 5 MG tablet Take 5 mg by mouth. Doctor at [facility] is adjusting how often for patient to take.    . fluticasone (FLONASE) 50 MCG/ACT nasal spray Place 2 sprays into both nostrils 2 (two) times daily as needed.    Marland Kitchen omeprazole (PRILOSEC) 20 MG capsule Take 1 capsule (20 mg total) by mouth daily. 30 capsule 11  . traMADol (ULTRAM) 50 MG tablet Take 1 tablet (50 mg total) by mouth every 8 (eight) hours as needed. 30 tablet 0  . beclomethasone (QVAR) 40 MCG/ACT inhaler Inhale into the lungs as needed.    . meloxicam (MOBIC) 15 MG tablet Take 1 tablet (15 mg total) by mouth daily. (Patient not taking: Reported on 06/01/2015) 30 tablet 0   Facility-Administered Medications Prior to Visit  Medication Dose Route Frequency Provider Last Rate Last Dose  . levalbuterol (XOPENEX) nebulizer solution 1.25 mg  1.25 mg Nebulization Once Lysbeth Penner, FNP        PAST MEDICAL HISTORY: Past Medical History  Diagnosis Date  . Former heavy cigarette smoker (20-39 per day)   .  H. pylori infection   . Chronic abdominal pain   . GERD (gastroesophageal reflux disease)   . COPD (chronic obstructive pulmonary disease)   . Anxiety disorder     PAST SURGICAL HISTORY: Past Surgical History  Procedure Laterality Date  . Hernia repair      x 2, inguinal, bilateral    FAMILY HISTORY: Family History  Problem Relation Age of Onset  . Liver disease Mother   . Alcoholism Mother     SOCIAL HISTORY:  History   Social History  . Marital Status:  Married    Spouse Name: Kenney Houseman  . Number of Children: 2  . Years of Education: 12   Occupational History  . disabled    Social History Main Topics  . Smoking status: Former Smoker -- 1.50 packs/day    Types: Cigarettes    Quit date: 09/18/2004  . Smokeless tobacco: Current User    Types: Chew     Comment: 2 or more cans smokeless per week  . Alcohol Use: No  . Drug Use: No  . Sexual Activity: Yes    Birth Control/ Protection: None   Other Topics Concern  . Not on file   Social History Narrative   Lives with spouse , 2 children   Caffeine use- none to rare     PHYSICAL EXAM     GENERAL EXAM/CONSTITUTIONAL: Vitals:  Filed Vitals:   06/01/15 1124  BP: 131/78  Pulse: 89  Height: 6' (1.829 m)  Weight: 178 lb 6.4 oz (80.922 kg)     Body mass index is 24.19 kg/(m^2).  Visual Acuity Screening   Right eye Left eye Both eyes  Without correction: 20/30 20/40   With correction:        Patient is in no distress; well developed, nourished and groomed; neck is supple  APPEARS OLDER THAN STATED AGE  CARDIOVASCULAR:  Examination of carotid arteries is normal; no carotid bruits  IRREGULARLY IRREGULAR RHYTHM; no murmurs  Examination of peripheral vascular system by observation and palpation is normal  EYES:  Ophthalmoscopic exam of optic discs and posterior segments is normal; no papilledema or hemorrhages  MUSCULOSKELETAL:  Gait, strength, tone, movements noted in Neurologic exam below  NEUROLOGIC: MENTAL STATUS:  No flowsheet data found.  awake, alert, oriented to person, place and time  recent and remote memory intact  normal attention and concentration  language fluent, comprehension intact, naming intact,   fund of knowledge appropriate  CRANIAL NERVE:   2nd - no papilledema on fundoscopic exam  2nd, 3rd, 4th, 6th - pupils equal and reactive to light, visual fields full to confrontation, extraocular muscles intact, no nystagmus  5th -  facial sensation symmetric  7th - facial strength symmetric  8th - hearing intact  9th - palate elevates symmetrically, uvula midline  11th - shoulder shrug symmetric  12th - tongue protrusion midline  MOTOR:   normal bulk and tone, full strength in the BUE, LLE; RLE HF 4, KE/KF 4, DF 3  SENSORY:   normal and symmetric to light touch, pinprick, temperature, vibration; SLIGHTLY DECR TEMP IN LEFT FOOT  COORDINATION:   finger-nose-finger, fine finger movements normal  REFLEXES:   deep tendon reflexes present and symmetric; BUE 2; KNEES 3; ANKLES TRACE; TOES MUTE  GAIT/STATION:   ANTALGIC GAIT; LIMPS ON RIGHT LEG    DIAGNOSTIC DATA (LABS, IMAGING, TESTING) - I reviewed patient records, labs, notes, testing and imaging myself where available.  Lab Results  Component Value Date  WBC 4.1* 07/02/2014   HGB 16.1 07/02/2014   HCT 47.5 07/02/2014   MCV 87.3 07/02/2014   PLT 180 03/10/2014      Component Value Date/Time   NA 140 03/09/2015 1137   NA 141 03/10/2014 1445   K 4.5 03/09/2015 1137   CL 101 03/09/2015 1137   CO2 24 03/09/2015 1137   GLUCOSE 101* 03/09/2015 1137   GLUCOSE 100* 03/10/2014 1445   BUN 10 03/09/2015 1137   BUN 11 03/10/2014 1445   CREATININE 1.04 03/09/2015 1137   CALCIUM 9.2 03/09/2015 1137   PROT 7.0 07/02/2014 1153   PROT 6.9 03/15/2014 1050   ALBUMIN 4.2 03/15/2014 1050   AST 22 07/02/2014 1153   ALT 15 07/02/2014 1153   ALKPHOS 82 07/02/2014 1153   BILITOT 0.6 07/02/2014 1153   GFRNONAA 86 03/09/2015 1137   GFRAA 99 03/09/2015 1137   Lab Results  Component Value Date   CHOL 170 07/02/2014   HDL 47 07/02/2014   LDLCALC 111* 07/02/2014   TRIG 59 07/02/2014   CHOLHDL 3.6 07/02/2014   No results found for: HGBA1C No results found for: VITAMINB12 Lab Results  Component Value Date   TSH 2.020 07/02/2014    03/30/15 MRI lumbar spine (report only) - At L4-5: tiny protusion and disc bulging with slightly affect right L4  ganglion    ASSESSMENT AND PLAN  44 y.o. 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 Other considerations would include CNS lesion such as stroke or cervical/thoracic myelopathy. We'll check further workup. Also patient noted to have irregular heart rhythm on exam today. Patient has family history fibrillation. Will refer to cardiology for further evaluation.   Ddx back pain: musculoskeletal strain, facet disease, lumbar radiculopathy  Ddx of right leg weakness: lumbar radiculopathy, peripheral neuropathy, left brain stroke (anterior cerebral artery)  Ddx hyperreflexia in legs: cervical or thoracic myelopathy  Ddx irreg heart beat: atrial fibrillation vs sinus arrhythmia   Visit diagnoses:  Bilateral low back pain with right-sided sciatica  Irregular heartbeat - Plan: Ambulatory referral to Cardiology  Numbness and tingling in right hand - Plan: MR Cervical Spine Wo Contrast  Hyperreflexia - Plan: MR Cervical Spine Wo Contrast, MR Thoracic Spine Wo Contrast  Right leg weakness - Plan: MR Thoracic Spine Wo Contrast    PLAN: -ADDITIONAL WORKUP: if negative, then will check MRI brain   Orders Placed This Encounter  Procedures  . MR Cervical Spine Wo Contrast  . MR Thoracic Spine Wo Contrast  . Vitamin B12  . TSH  . Hemoglobin A1c  . Ambulatory referral to Cardiology  . Ambulatory referral to Physical Therapy   Return in about 3 months (around 09/01/2015).    Penni Bombard, MD 07/08/3715, 96:78 PM Certified in Neurology, Neurophysiology and Neuroimaging  [facility] [address] [place], [place] [address] [phone]

## 2015-06-01 NOTE — Patient Instructions (Signed)
I will check MRI cervical and thoracic spine.  I will refer you to cardiology for irregular heartbeat.  I will setup physical therapy.

## 2015-06-02 ENCOUNTER — Telehealth: Payer: Self-pay | Admitting: *Deleted

## 2015-06-02 LAB — HEMOGLOBIN A1C
Est. average glucose Bld gHb Est-mCnc: 114 mg/dL
Hgb A1c MFr Bld: 5.6 % (ref 4.8–5.6)

## 2015-06-02 LAB — TSH: TSH: 1.67 u[IU]/mL (ref 0.450–4.500)

## 2015-06-02 LAB — VITAMIN B12: Vitamin B-12: 462 pg/mL (ref 211–946)

## 2015-06-02 NOTE — Telephone Encounter (Signed)
Left message requesting patient call back re: would he prefer PT referral sent to [facility] rehab or [facility], since he lives in [place]. Requested he leave detailed message when he returns call.

## 2015-06-06 NOTE — Telephone Encounter (Signed)
-----   Message from Penni Bombard, MD sent at 06/06/2015  8:43 AM EDT ----- Please call with normal labs.  -VRP

## 2015-06-06 NOTE — Telephone Encounter (Signed)
Patient called back and stated [facility] for rehab would be fine with him. Please call.

## 2015-06-06 NOTE — Telephone Encounter (Addendum)
Left vm for patient re: per Dr Leta Baptist, lab results from last week are all normal. Also informed him that his PT referral was sent to [facility], but requested that if he wants PT at [facility], that he call and advise. Left this caller's name, number for further questions.  4:20 pm Spoke with patient and informed him referral to [facility] was successfully faxed this afternoon. Advised if he does not receive a call to schedule PT by the end of this week, he should call to FU on referral. He verbalized understanding, agreement.

## 2015-06-15 ENCOUNTER — Ambulatory Visit
Admission: RE | Admit: 2015-06-15 | Discharge: 2015-06-15 | Disposition: A | Discharge status: Home / Self Care | Payer: Medicare Other | Source: Ambulatory Visit | Attending: Diagnostic Neuroimaging | Admitting: Diagnostic Neuroimaging

## 2015-06-15 DIAGNOSIS — R200 Anesthesia of skin: Secondary | ICD-10-CM | POA: Diagnosis not present

## 2015-06-15 DIAGNOSIS — R29898 Other symptoms and signs involving the musculoskeletal system: Secondary | ICD-10-CM

## 2015-06-15 DIAGNOSIS — R202 Paresthesia of skin: Secondary | ICD-10-CM | POA: Diagnosis not present

## 2015-06-15 DIAGNOSIS — R292 Abnormal reflex: Secondary | ICD-10-CM

## 2015-06-27 ENCOUNTER — Ambulatory Visit (INDEPENDENT_AMBULATORY_CARE_PROVIDER_SITE_OTHER): Payer: Medicare Other | Admitting: Cardiovascular Disease

## 2015-06-27 ENCOUNTER — Encounter: Payer: Self-pay | Admitting: Cardiovascular Disease

## 2015-06-27 ENCOUNTER — Ambulatory Visit: Payer: Medicare Other | Admitting: Family

## 2015-06-27 VITALS — BP 130/76 | HR 83 | Ht 72.0 in | Wt 175.0 lb

## 2015-06-27 DIAGNOSIS — I499 Cardiac arrhythmia, unspecified: Secondary | ICD-10-CM

## 2015-06-27 DIAGNOSIS — Z136 Encounter for screening for cardiovascular disorders: Secondary | ICD-10-CM | POA: Diagnosis not present

## 2015-06-27 NOTE — Progress Notes (Signed)
Patient ID: Warren Miller, male   DOB: 07-18-1971, 44 y.o.   MRN: [id number]       CARDIOLOGY CONSULT NOTE  Patient ID: Warren Miller MRN: [id number] DOB/AGE: [birth year] 44 y.o.  Admit date: (Not on file) Primary Physician Redge Gainer, MD  Reason for Consultation: arrhythmia  HPI: The patient is a 44 year old male was recently evaluated by his neurologist for right leg numbness due to posttraumatic lumbar radiculopathy, and was found to have an "irregular heartbeat". He has a history of COPD and prior history of tobacco abuse. There was some question of atrial fibrillation versus sinus arrhythmia but an ECG was not performed.  ECG performed in the office today demonstrates normal sinus rhythm with no ischemic ST segment or T-wave abnormalities, nor any arrhythmias, HR 80 bpm.  Says he has a long history of anxiety and has been evaluated several times for chest pain in the ED and was told each time "Your heart is fine". Denies exertional chest pain and dyspnea. Chest pains he experiences come on without warning and spontaneously resolve.  Denies syncope. Said he borrowed a friend's stethoscope the other day and listened to his heart for 10 minutes and did not appreciate a skipped beat.   Allergies  Allergen Reactions  . Other Shortness Of Breath    Patient states he had a reaction (shortness of breath) after receiving an antibiotic for a H-pylori  infection in the past. Name of medication unknown. Patient can take AMOXICILLIN.  Marland Kitchen Tetracyclines & Related     Patient thinks this is medication that he is allergic to    Current Outpatient Prescriptions  Medication Sig Dispense Refill  . albuterol (PROVENTIL HFA;VENTOLIN HFA) 108 (90 BASE) MCG/ACT inhaler Inhale 2 puffs into the lungs every 6 (six) hours as needed for wheezing or shortness of breath. 1 Inhaler 11  . beclomethasone (QVAR) 40 MCG/ACT inhaler Inhale into the lungs as needed.    . budesonide-formoterol (SYMBICORT)  160-4.5 MCG/ACT inhaler Inhale 2 puffs into the lungs 2 (two) times daily. 1 Inhaler 11  . cyclobenzaprine (FLEXERIL) 10 MG tablet TAKE ONE TABLET BY MOUTH THREE TIMES DAILY AS NEEDED FOR MUSCLE SPASMS 30 tablet 1  . diazepam (VALIUM) 5 MG tablet Take 5 mg by mouth. Doctor at [facility] is adjusting how often for patient to take.    . fluticasone (FLONASE) 50 MCG/ACT nasal spray Place 2 sprays into both nostrils 2 (two) times daily as needed.    Marland Kitchen omeprazole (PRILOSEC) 20 MG capsule Take 1 capsule (20 mg total) by mouth daily. 30 capsule 11   Current Facility-Administered Medications  Medication Dose Route Frequency Provider Last Rate Last Dose  . levalbuterol (XOPENEX) nebulizer solution 1.25 mg  1.25 mg Nebulization Once Lysbeth Penner, FNP        Past Medical History  Diagnosis Date  . Former heavy cigarette smoker (20-39 per day)   . H. pylori infection   . Chronic abdominal pain   . GERD (gastroesophageal reflux disease)   . COPD (chronic obstructive pulmonary disease)   . Anxiety disorder     Past Surgical History  Procedure Laterality Date  . Hernia repair      x 2, inguinal, bilateral    Social History   Social History  . Marital Status: Married    Spouse Name: Kenney Houseman  . Number of Children: 2  . Years of Education: 12   Occupational History  . disabled    Social History Main Topics  .  Smoking status: Former Smoker -- 1.50 packs/day    Types: Cigarettes    Quit date: 09/18/2004  . Smokeless tobacco: Current User    Types: Chew     Comment: 2 or more cans smokeless per week  . Alcohol Use: No  . Drug Use: No  . Sexual Activity: Yes    Birth Control/ Protection: None   Other Topics Concern  . Not on file   Social History Narrative   Lives with spouse , 2 children   Caffeine use- none to rare     No family history of premature CAD in 1st degree relatives.  Prior to Admission medications   Medication Sig Start Date End Date Taking? Authorizing Provider    albuterol (PROVENTIL HFA;VENTOLIN HFA) 108 (90 BASE) MCG/ACT inhaler Inhale 2 puffs into the lungs every 6 (six) hours as needed for wheezing or shortness of breath. 07/19/14   Lysbeth Penner, FNP  beclomethasone (QVAR) 40 MCG/ACT inhaler Inhale into the lungs as needed.    Historical Provider, MD  budesonide-formoterol (SYMBICORT) 160-4.5 MCG/ACT inhaler Inhale 2 puffs into the lungs 2 (two) times daily. 07/02/14   Lysbeth Penner, FNP  cyclobenzaprine (FLEXERIL) 10 MG tablet TAKE ONE TABLET BY MOUTH THREE TIMES DAILY AS NEEDED FOR MUSCLE SPASMS 05/20/15   Sharion Balloon, FNP  diazepam (VALIUM) 5 MG tablet Take 5 mg by mouth. Doctor at [facility] is adjusting how often for patient to take. 03/10/15   Historical Provider, MD  fluticasone (FLONASE) 50 MCG/ACT nasal spray Place 2 sprays into both nostrils 2 (two) times daily as needed. 03/04/14   Historical Provider, MD  meloxicam (MOBIC) 15 MG tablet Take 1 tablet (15 mg total) by mouth daily. Patient not taking: Reported on 06/01/2015 03/09/15   Sharion Balloon, FNP  omeprazole (PRILOSEC) 20 MG capsule Take 1 capsule (20 mg total) by mouth daily. 07/02/14   Lysbeth Penner, FNP  traMADol (ULTRAM) 50 MG tablet Take 1 tablet (50 mg total) by mouth every 8 (eight) hours as needed. 03/23/15   Sharion Balloon, FNP     Review of systems complete and found to be negative unless listed above in HPI     Physical exam Blood pressure 130/76, pulse 83, height 6' (1.829 m), weight 175 lb (79.379 kg), SpO2 98 %. General: NAD Neck: No JVD, no thyromegaly or thyroid nodule.  Lungs: Diminished sounds but no rales or wheezes. CV: Nondisplaced PMI. Regular rate and rhythm, normal S1/S2, no S3/S4, no murmur.  No peripheral edema. No carotid bruit.  Abdomen: Soft, nontender, no distention.  Skin: Intact without lesions or rashes.  Neurologic: Alert and oriented x 3.  Psych: Normal affect. Extremities: No clubbing or cyanosis.  HEENT: Poor dentition.  ECG: Most  recent ECG reviewed.  Labs:   Lab Results  Component Value Date   WBC 4.1* 07/02/2014   HGB 16.1 07/02/2014   HCT 47.5 07/02/2014   MCV 87.3 07/02/2014   PLT 180 03/10/2014   No results for input(s): NA, K, CL, CO2, BUN, CREATININE, CALCIUM, PROT, BILITOT, ALKPHOS, ALT, AST, GLUCOSE in the last 168 hours.  Invalid input(s): LABALBU Lab Results  Component Value Date   TROPONINI <0.30 03/10/2014    Lab Results  Component Value Date   CHOL 170 07/02/2014   Lab Results  Component Value Date   HDL 47 07/02/2014   Lab Results  Component Value Date   LDLCALC 111* 07/02/2014   Lab Results  Component Value Date  TRIG 59 07/02/2014   Lab Results  Component Value Date   CHOLHDL 3.6 07/02/2014   No results found for: LDLDIRECT       Studies: No results found.  ASSESSMENT AND PLAN:  1. Arrhythmia: ECG without abnormalities. He says many of his symptoms are attributable to anxiety. No abnormalities on cardiovascular exam. At this time, I do not feel any additional evaluation is warranted. If he were to develop significant palpitations or tachycardia, cardiac monitoring could then be considered. I do not feel any noninvasive imaging is warranted at this time.  Dispo: f/u prn.   Signed: Kate Sable, M.D., F.A.C.C.  06/27/2015, 8:35 AM

## 2015-06-27 NOTE — Patient Instructions (Signed)
Your physician recommends that you continue on your current medications as directed. Please refer to the Current Medication list given to you today.   Your physician recommends that you schedule a follow-up appointment as needed  

## 2015-06-28 ENCOUNTER — Encounter: Payer: Self-pay | Admitting: Family

## 2015-06-28 ENCOUNTER — Ambulatory Visit (INDEPENDENT_AMBULATORY_CARE_PROVIDER_SITE_OTHER): Payer: Medicare Other | Admitting: Family

## 2015-06-28 VITALS — BP 125/83 | HR 98 | Temp 97.7°F | Ht 72.0 in | Wt 173.6 lb

## 2015-06-28 DIAGNOSIS — J449 Chronic obstructive pulmonary disease, unspecified: Secondary | ICD-10-CM | POA: Diagnosis not present

## 2015-06-28 DIAGNOSIS — M5441 Lumbago with sciatica, right side: Secondary | ICD-10-CM

## 2015-06-28 DIAGNOSIS — F411 Generalized anxiety disorder: Secondary | ICD-10-CM | POA: Insufficient documentation

## 2015-06-28 DIAGNOSIS — K219 Gastro-esophageal reflux disease without esophagitis: Secondary | ICD-10-CM

## 2015-06-28 MED ORDER — TRAMADOL HCL 50 MG PO TABS: 100.0000 mg | ORAL_TABLET | Freq: Two times a day (BID) | ORAL | Status: DC | PRN

## 2015-06-28 NOTE — Progress Notes (Signed)
Subjective:    Patient ID: Warren Miller, male    DOB: November 04, 1970, 44 y.o.   MRN: [id number]  Pt presents to the office today for chronic follow up. Pt states he went to an ortho doctor who did an MRI and pt was told he had mild degenerative changes.  Back Pain This is a chronic problem. The problem occurs constantly. The problem has been waxing and waning since onset. The pain is present in the lumbar spine and gluteal. The quality of the pain is described as aching. The pain radiates to the right thigh. The pain is at a severity of 5/10. The pain is moderate. Associated symptoms include headaches, leg pain and tingling. Pertinent negatives include no bladder incontinence, bowel incontinence, chest pain, dysuria or paresthesias. He has tried muscle relaxant and NSAIDs for the symptoms. The treatment provided mild relief.  Gastrophageal Reflux He reports no chest pain or no heartburn. This is a chronic problem. The current episode started more than 1 year ago. The problem occurs rarely. The symptoms are aggravated by certain foods and lying down. He has tried a PPI for the symptoms. The treatment provided significant relief.  Anxiety Presents for follow-up (Pt goes to [facility] every 2-3 months) visit. The problem has been waxing and waning. Symptoms include depressed mood, excessive worry, nervous/anxious behavior, restlessness and shortness of breath. Patient reports no chest pain or insomnia. Symptoms occur occasionally. The symptoms are aggravated by family issues and medication withdrawal.   His past medical history is significant for anxiety/panic attacks and depression.  COPD Pt currently taking Symbicort as needed. Pt educated on importance of taking BID. Pt currently using smokeless tobacco and states he quit smoking 10-11 years ago. Pt states he feels SOB everyday and is coughing constantly.     Review of Systems  Constitutional: Negative.   Respiratory: Positive for shortness of  breath.   Cardiovascular: Negative.  Negative for chest pain.  Gastrointestinal: Negative.  Negative for heartburn and bowel incontinence.  Endocrine: Negative.   Genitourinary: Negative.  Negative for bladder incontinence and dysuria.  Musculoskeletal: Positive for back pain.  Neurological: Positive for tingling and headaches. Negative for paresthesias.  Hematological: Negative.   Psychiatric/Behavioral: The patient is nervous/anxious. The patient does not have insomnia.   All other systems reviewed and are negative.      Objective:   Physical Exam  Constitutional: He is oriented to person, place, and time. He appears well-developed and well-nourished. No distress.  HENT:  Head: Normocephalic.  Right Ear: External ear normal.  Left Ear: External ear normal.  Nose: Nose normal.  Mouth/Throat: Oropharynx is clear and moist.  Eyes: Pupils are equal, round, and reactive to light. Right eye exhibits no discharge. Left eye exhibits no discharge.  Neck: Normal range of motion. Neck supple. No thyromegaly present.  Cardiovascular: Normal rate, regular rhythm, normal heart sounds and intact distal pulses.   No murmur heard. Pulmonary/Chest: Effort normal and breath sounds normal. No respiratory distress. He has no wheezes.  Abdominal: Soft. Bowel sounds are normal. He exhibits no distension. There is no tenderness.  Musculoskeletal: Normal range of motion. He exhibits no edema or tenderness.  Neurological: He is alert and oriented to person, place, and time. He has normal reflexes. No cranial nerve deficit.  Skin: Skin is warm and dry. No rash noted. No erythema.  Psychiatric: He has a normal mood and affect. His behavior is normal. Judgment and thought content normal.  Vitals reviewed.   BP 125/83  mmHg  Pulse 98  Temp(Src) 97.7 F (36.5 C) (Oral)  Ht 6' (1.829 m)  Wt 173 lb 9.6 oz (78.744 kg)  BMI 23.54 kg/m2       Assessment & Plan:  1. Gastroesophageal reflux disease,  esophagitis presence not specified -Diet discussed -Continue Prilosec dialy - CMP14+EGFR  2. Bilateral low back pain with right-sided sciatica --Keep neurology appt - CMP14+EGFR - traMADol (ULTRAM) 50 MG tablet; Take 2 tablets (100 mg total) by mouth every 12 (twelve) hours as needed.  Dispense: 90 tablet; Refill: 0  3. Chronic obstructive pulmonary disease, unspecified COPD, unspecified chronic bronchitis type - CMP14+EGFR  4. GAD (generalized anxiety disorder) -Stress management discussed -Keep follow up appts with [facility]   Continue all meds Labs pending Health Maintenance reviewed Diet and exercise encouraged RTO 6 months  Evelina Dun, FNP

## 2015-06-28 NOTE — Patient Instructions (Signed)

## 2015-06-29 ENCOUNTER — Ambulatory Visit (HOSPITAL_COMMUNITY): Payer: Medicare Other | Attending: Diagnostic Neuroimaging | Admitting: Physical Therapy

## 2015-06-29 ENCOUNTER — Telehealth: Payer: Self-pay

## 2015-06-29 DIAGNOSIS — Z7409 Other reduced mobility: Secondary | ICD-10-CM

## 2015-06-29 DIAGNOSIS — M25651 Stiffness of right hip, not elsewhere classified: Secondary | ICD-10-CM

## 2015-06-29 DIAGNOSIS — R2681 Unsteadiness on feet: Secondary | ICD-10-CM | POA: Diagnosis not present

## 2015-06-29 DIAGNOSIS — M545 Low back pain, unspecified: Secondary | ICD-10-CM

## 2015-06-29 DIAGNOSIS — R531 Weakness: Secondary | ICD-10-CM

## 2015-06-29 DIAGNOSIS — M2569 Stiffness of other specified joint, not elsewhere classified: Secondary | ICD-10-CM

## 2015-06-29 DIAGNOSIS — M2560 Stiffness of unspecified joint, not elsewhere classified: Secondary | ICD-10-CM | POA: Insufficient documentation

## 2015-06-29 DIAGNOSIS — R293 Abnormal posture: Secondary | ICD-10-CM

## 2015-06-29 DIAGNOSIS — M25652 Stiffness of left hip, not elsewhere classified: Secondary | ICD-10-CM

## 2015-06-29 LAB — CMP14+EGFR
ALBUMIN: 4.2 g/dL (ref 3.5–5.5)
ALT: 16 IU/L (ref 0–44)
AST: 18 IU/L (ref 0–40)
Albumin/Globulin Ratio: 1.6 (ref 1.1–2.5)
Alkaline Phosphatase: 86 IU/L (ref 39–117)
BUN/Creatinine Ratio: 7 — ABNORMAL LOW (ref 9–20)
BUN: 7 mg/dL (ref 6–24)
Bilirubin Total: 0.5 mg/dL (ref 0.0–1.2)
CALCIUM: 9.2 mg/dL (ref 8.7–10.2)
CHLORIDE: 100 mmol/L (ref 97–108)
CO2: 25 mmol/L (ref 18–29)
Creatinine, Ser: 0.99 mg/dL (ref 0.76–1.27)
GFR calc Af Amer: 105 mL/min/{1.73_m2} (ref >59)
GFR calc non Af Amer: 91 mL/min/{1.73_m2} (ref >59)
GLUCOSE: 86 mg/dL (ref 65–99)
Globulin, Total: 2.7 g/dL (ref 1.5–4.5)
Potassium: 4.2 mmol/L (ref 3.5–5.2)
Sodium: 142 mmol/L (ref 134–144)
Total Protein: 6.9 g/dL (ref 6.0–8.5)

## 2015-06-29 NOTE — Therapy (Signed)
[place], [place], [address] Phone: [phone]   Fax:  [phone]  Physical Therapy Evaluation  Patient Details  Name: Warren Miller MRN: [id number] Date of Birth: [birth year] Referring Provider:  Penni Bombard, MD  Encounter Date: 06/29/2015      PT End of Session - 06/29/15 1518    Visit Number 1   Number of Visits 16   Date for PT Re-Evaluation 07/27/15   Authorization Type Medicare/Medicaid    Authorization Time Period 06/29/15 to 08/29/15   Authorization - Visit Number 1   Authorization - Number of Visits 10   PT Start Time 1430   PT Stop Time 1513   PT Time Calculation (min) 43 min   Activity Tolerance Patient limited by pain   Behavior During Therapy [facility] for tasks assessed/performed;Anxious      Past Medical History  Diagnosis Date  . Former heavy cigarette smoker (20-39 per day)   . H. pylori infection   . Chronic abdominal pain   . GERD (gastroesophageal reflux disease)   . COPD (chronic obstructive pulmonary disease)   . Anxiety disorder     Past Surgical History  Procedure Laterality Date  . Hernia repair      x 2, inguinal, bilateral    There were no vitals filed for this visit.  Visit Diagnosis:  Midline low back pain - Plan: PT plan of care cert/re-cert  Back stiffness - Plan: PT plan of care cert/re-cert  Generalized weakness - Plan: PT plan of care cert/re-cert  Poor posture - Plan: PT plan of care cert/re-cert  Unsteadiness - Plan: PT plan of care cert/re-cert  Hip joint stiffness, left - Plan: PT plan of care cert/re-cert  Hip stiffness, right - Plan: PT plan of care cert/re-cert  Impaired functional mobility and activity tolerance - Plan: PT plan of care cert/re-cert      Subjective Assessment - 06/29/15 1433    Subjective R leg feels weak all the time, patient reports he has to watch it carefully or he'll trip; he reports that his back is painful usually.  Standing up or sitting too long can make symptoms worse. Stretching back out and balling up tends to make pain better.    Pertinent History Back pain and leg issues started after patient stepped in a hole at work and twisted, felt a pop in his back when it happened. Fall was a long time ago.    How long can you sit comfortably? 30-40 minutes    How long can you stand comfortably? 20 minutes    How long can you walk comfortably? 20 minutes    Patient Stated Goals reduce pain in back, help to strength R leg    Currently in Pain? Yes   Pain Score 5    Pain Location Back            [facility] PT Assessment - 06/29/15 0001    Assessment   Medical Diagnosis back and R leg pain    Onset Date/Surgical Date --  chronic    Next MD Visit patient not sure of when follow up is   Precautions   Precautions Other (comment)   Precaution Comments high anxiety    Restrictions   Weight Bearing Restrictions No   Balance Screen   Has the patient fallen in the past 6 months No   Has the patient had a decrease in activity level because of a fear of falling?  Yes   Is the patient reluctant to leave their home because of a fear of falling?  Yes   Prior Function   Level of Independence Independent with gait;Independent with transfers;Needs assistance with ADLs   Vocation On disability   Leisure deer hunting, fishing    Observation/Other Assessments   Observations Very mild clonus R LE; difficulty with tandem stance and SLS (unable to perform more than 5-10 seconds)   Posture/Postural Control   Posture Comments flexed at hips iwth difficulty standing up, forward head with B IR shoulders, reduced spinal curvature, reduced weight bearing R LE    AROM   Right Hip External Rotation  26   Right Hip Internal Rotation  43   Left Hip External Rotation  25   Left Hip Internal Rotation  44   Lumbar Flexion 26   Lumbar Extension -7   Lumbar - Right Side Bend 16   Lumbar - Left Side Bend 17   Lumbar - Right  Rotation --  unable to tolerate supine    Lumbar - Left Rotation --  unable to tolerate supine    Strength   Right Hip Flexion 2/5   Right Hip Extension --  unable to tolerate prone    Right Hip ABduction 2/5   Left Hip Flexion 2/5   Left Hip Extension --  unable to tolerate prone    Left Hip ABduction 2/5   Right Knee Flexion 2/5   Right Knee Extension 2+/5   Left Knee Flexion 3/5   Left Knee Extension 3/5   Right Ankle Dorsiflexion 3-/5   Left Ankle Dorsiflexion 4/5   Ambulation/Gait   Gait Comments flexed at hips, reduced gait speed, reduced stance R/reduced step length L, reduced reotation of hips and trunk, hip ER, difficulty with turns with potential posterior LOB                            PT Education - 06/29/15 1517    Education provided Yes   Education Details prognosis, plan of care moving forward, HEP    Person(s) Educated Patient   Methods Explanation;Demonstration;Handout   Comprehension Verbalized understanding;Returned demonstration          PT Short Term Goals - 06/29/15 1527    PT SHORT TERM GOAL #1   Title Patient will experience no more than 4/10 pain in his back and R leg during all functional activity performance    Time 4   Period Weeks   Status New   PT SHORT TERM GOAL #2   Title Patient will improve gross muscle strength to at least 3+/5 in bilateral lower extremities and proximal muscle strength to at least 3-/5    Time 4   Period Weeks   Status New   PT SHORT TERM GOAL #3   Title Patient will improve bilateral hip ER and lumbar mobility by at least 8 degrees with no increases in pain    Time 4   Period Weeks   Status New   PT SHORT TERM GOAL #4   Title Patient will be able to verbalize and return demonstration of correct postural strategies in order to reduce muscle imbalance and assist him in reducing pain and improving function    Time 4   Period Weeks   Status New   PT SHORT TERM GOAL #5   Title Patient will be  independent in correctly and consistently performing appropriate HEP, to be updated PRN  Time 4   Period Weeks   Status New           PT Long Term Goals - 07/27/2015 1532    PT LONG TERM GOAL #1   Title Patient will experience no more than 3/10 pain in his low back and R leg during all functional activities and gait    Time 8   Period Weeks   Status New   PT LONG TERM GOAL #2   Title Patient will be able to maintain tandem stance at least 30 seconds and SLS at least 20 seconds each leg    Time 8   Period Weeks   Status New   PT LONG TERM GOAL #3   Title Patient will demonstrate at least 4/5 strength in bilateral lower extremities and at least 4-/5 in proximal muscle strength    Time 8   Period Weeks   Status New   PT LONG TERM GOAL #4   Title Patient will be able to perform standing tasks for at least 60 minutes and ambulation for at least 60 minutes with minimal fatigue and pain no more than 3/10   Time 8   Period Weeks   Status New   PT LONG TERM GOAL #5   Title Patient will improve bilateral hip ER and lumbar ROM by at least 15 degrees in order to assist with correcting posture and reducing pain    Time 8   Period Weeks   Status New               Plan - 27-Jul-2015 1519    Clinical Impression Statement Patient presents with chronic low back pain and R leg weakness, significant gait and postural impairments, generalized muscle weakness, impaired balance, reduced functional activity tolerance, and impaired functional task performance. Patient does advise that he has quite a bit of anxiety and can sometimes come across as being "rough" when he is really trying to be nice, and just wants to make sure he doesn't hurt anyone's feelings. Patient reports that he is unable to tolerate prone or supine presently, and that he does experience some muscle cramping on a regular basis. He is also unable to extend past approximately -7 degrees lumbar extension due to pain. The patient  will benefit from continuation of skilled PT services in order to address his functional  impairments and to assist him  in reaching an optimal level of function.    Pt will benefit from skilled therapeutic intervention in order to improve on the following deficits Abnormal gait;Decreased endurance;Hypomobility;Decreased activity tolerance;Decreased strength;Pain;Decreased balance;Decreased mobility;Difficulty walking;Improper body mechanics;Decreased coordination;Impaired flexibility;Postural dysfunction   Rehab Potential Fair   Clinical Impairments Affecting Rehab Potential chronic injury and pain    PT Frequency 2x / week   PT Duration 8 weeks   PT Treatment/Interventions ADLs/Self Care Home Management;Electrical Stimulation;Moist Heat;Gait training;Stair training;Functional mobility training;Therapeutic activities;Therapeutic exercise;Balance training;Neuromuscular re-education;Patient/family education;Manual techniques   PT Next Visit Plan review HEP and goals; mobility of spine to tolerance, functional stretching and strengthening as tolerated.    PT Home Exercise Plan given    Consulted and Agree with Plan of Care Patient          G-Codes - Jul 27, 2015 1540    Functional Assessment Tool Used Based on skilled clinical assessment of strength, posture, pain, mobility, gait, ROM   Functional Limitation Mobility: Walking and moving around   Mobility: Walking and Moving Around Current Status ([id number]) At least 60 percent but less than 80 percent impaired,  limited or restricted   Mobility: Walking and Moving Around Goal Status [phone]) At least 40 percent but less than 60 percent impaired, limited or restricted       Problem List Patient Active Problem List   Diagnosis Date Noted  . GERD (gastroesophageal reflux disease) 06/28/2015  . COPD (chronic obstructive pulmonary disease) 06/28/2015  . GAD (generalized anxiety disorder) 06/28/2015  . Arrhythmia 06/27/2015  . BACK PAIN, LUMBAR, CHRONIC  06/29/2010  . WEIGHT LOSS 06/29/2010  . CHANGE IN BOWELS 06/29/2010  . EPIGASTRIC PAIN 06/29/2010    Deniece Ree PT, DPT [place] [address] [place], [place], [address] Phone: [phone]   Fax:  [phone]

## 2015-06-29 NOTE — Therapy (Signed)
[place], [place], [address] Phone: [phone]   Fax:  [phone]  Patient Details  Name: GRANTLEY SAVAGE MRN: [id number] Date of Birth: 1971-03-25 Referring Provider:  Penni Bombard, MD  Encounter Date: 06/29/2015   Patient has Medicaid as a secondary insurance; Medicaid application was submitted this afternoon.   Deniece Ree PT, DPT [phone]  [place] [address] [place], [place], [address] Phone: [phone]   Fax:  [phone]

## 2015-06-29 NOTE — Telephone Encounter (Signed)
LVM for patient call office back to receive MRI results.  Ok to inform patient that MRI of the thoracic and spine is normal.

## 2015-06-29 NOTE — Patient Instructions (Signed)
   ANKLE PUMPS - AP  Bend your foot up and down at your ankle joint as shown. You can do this exercise sitting or laying down.  Repeat 10-15 times, 2 times per day.    LONG ARC QUAD - LAQ - HIGH SEAT  While seated with your knee in a bent position, slowly straighten your knee as you raise your foot upwards as shown.   Hold for 2-3 seconds. Before letting your leg go back down.  Repeat 10 times each leg, twice a day.   Functional Quadriceps: Sit to Stand   Sit on edge of chair, feet flat on floor. Stand upright, extending knees fully. Repeat __5-10__ times per set. Do _1___ sets per session. Do _2___ sessions per day.  http://orth.exer.us/734   Copyright  VHI. All rights reserved.   ABDOMINAL SQUEEZES  Squeeze your stomach muscles; it should feel like you are trying to squeeze your belly button to your spine. Hold for a count of 3 and relax. Repeat 100 times each day; however you may break it down however you like, IE 10 sets of 10, 5 sets of 20, etc, as long as the total equals 100. You can do this exercise in standing, sitting, or laying down- really however you are most comfortable.

## 2015-07-05 NOTE — Telephone Encounter (Signed)
Patient returned Joy's phone call.I relayed message to pt. He had no questions.

## 2015-07-12 ENCOUNTER — Ambulatory Visit (HOSPITAL_COMMUNITY): Payer: Medicare Other | Attending: Diagnostic Neuroimaging | Admitting: Physical Therapy

## 2015-07-12 DIAGNOSIS — R2681 Unsteadiness on feet: Secondary | ICD-10-CM | POA: Insufficient documentation

## 2015-07-12 DIAGNOSIS — M25651 Stiffness of right hip, not elsewhere classified: Secondary | ICD-10-CM | POA: Insufficient documentation

## 2015-07-12 DIAGNOSIS — M2560 Stiffness of unspecified joint, not elsewhere classified: Secondary | ICD-10-CM | POA: Insufficient documentation

## 2015-07-12 DIAGNOSIS — M25652 Stiffness of left hip, not elsewhere classified: Secondary | ICD-10-CM | POA: Insufficient documentation

## 2015-07-12 DIAGNOSIS — M545 Low back pain: Secondary | ICD-10-CM | POA: Insufficient documentation

## 2015-07-12 DIAGNOSIS — R293 Abnormal posture: Secondary | ICD-10-CM | POA: Insufficient documentation

## 2015-07-12 DIAGNOSIS — R531 Weakness: Secondary | ICD-10-CM | POA: Insufficient documentation

## 2015-07-12 DIAGNOSIS — Z7409 Other reduced mobility: Secondary | ICD-10-CM | POA: Insufficient documentation

## 2015-07-14 ENCOUNTER — Ambulatory Visit (HOSPITAL_COMMUNITY): Payer: Medicare Other | Admitting: Physical Therapy

## 2015-07-14 DIAGNOSIS — R531 Weakness: Secondary | ICD-10-CM

## 2015-07-14 DIAGNOSIS — Z7409 Other reduced mobility: Secondary | ICD-10-CM | POA: Diagnosis not present

## 2015-07-14 DIAGNOSIS — M25652 Stiffness of left hip, not elsewhere classified: Secondary | ICD-10-CM

## 2015-07-14 DIAGNOSIS — R293 Abnormal posture: Secondary | ICD-10-CM

## 2015-07-14 DIAGNOSIS — M2560 Stiffness of unspecified joint, not elsewhere classified: Secondary | ICD-10-CM

## 2015-07-14 DIAGNOSIS — R2681 Unsteadiness on feet: Secondary | ICD-10-CM

## 2015-07-14 DIAGNOSIS — M2569 Stiffness of other specified joint, not elsewhere classified: Secondary | ICD-10-CM

## 2015-07-14 DIAGNOSIS — M545 Low back pain, unspecified: Secondary | ICD-10-CM

## 2015-07-14 DIAGNOSIS — M25651 Stiffness of right hip, not elsewhere classified: Secondary | ICD-10-CM

## 2015-07-14 NOTE — Therapy (Signed)
[place], [place], [address] Phone: [phone]   Fax:  [phone]  Physical Therapy Treatment  Patient Details  Name: Warren Miller MRN: [id number] Date of Birth: Feb 13, 1971 Referring Provider:  Penni Bombard, MD  Encounter Date: 07/14/2015      PT End of Session - 07/14/15 1152    Visit Number 2   Number of Visits 16   Date for PT Re-Evaluation 07/27/15   Authorization Type Medicare/Medicaid    Authorization Time Period 06/29/15 to 08/29/15   Authorization - Visit Number 2   Authorization - Number of Visits 10   PT Start Time 1104   PT Stop Time 1148   PT Time Calculation (min) 44 min   Activity Tolerance Patient limited by pain   Behavior During Therapy [facility] for tasks assessed/performed;Anxious      Past Medical History  Diagnosis Date  . Former heavy cigarette smoker (20-39 per day)   . H. pylori infection   . Chronic abdominal pain   . GERD (gastroesophageal reflux disease)   . COPD (chronic obstructive pulmonary disease)   . Anxiety disorder     Past Surgical History  Procedure Laterality Date  . Hernia repair      x 2, inguinal, bilateral    There were no vitals filed for this visit.  Visit Diagnosis:  Midline low back pain  Back stiffness  Generalized weakness  Poor posture  Unsteadiness  Hip joint stiffness, left  Hip stiffness, right  Impaired functional mobility and activity tolerance      Subjective Assessment - 07/14/15 1200    Subjective PT states his Rt LE is weak all the time and is scared it will not hold him up.  Reports pain of 5/10 in lumbar spine today.   Currently in Pain? Yes   Pain Score 5    Pain Location Back                         [facility] Adult PT Treatment/Exercise - 07/14/15 0001    Lumbar Exercises: Stretches   Active Hamstring Stretch 3 reps;30 seconds   Active Hamstring Stretch Limitations 12" step   Lumbar Exercises:  Aerobic   Stationary Bike nustep LE only 8 minutes hills #2 level #3 SPM avg at 30 SPM   Lumbar Exercises: Standing   Heel Raises 10 reps   Lumbar Exercises: Seated   Sit to Stand Limitations 10 reps from mat with intermittent HHA   Lumbar Exercises: Supine   Ab Set 10 reps;5 seconds   Bent Knee Raise 10 reps   Bridge 10 reps   Straight Leg Raise 10 reps                PT Education - 07/14/15 1153    Education provided Yes   Education Details posture and breathing, given copy of evaluation with explanation of goals   Person(s) Educated Patient   Methods Explanation;Demonstration;Handout   Comprehension Verbalized understanding;Need further instruction          PT Short Term Goals - 06/29/15 1527    PT SHORT TERM GOAL #1   Title Patient will experience no more than 4/10 pain in his back and R leg during all functional activity performance    Time 4   Period Weeks   Status New   PT SHORT TERM GOAL #2   Title Patient will improve gross muscle strength to at least  3+/5 in bilateral lower extremities and proximal muscle strength to at least 3-/5    Time 4   Period Weeks   Status New   PT SHORT TERM GOAL #3   Title Patient will improve bilateral hip ER and lumbar mobility by at least 8 degrees with no increases in pain    Time 4   Period Weeks   Status New   PT SHORT TERM GOAL #4   Title Patient will be able to verbalize and return demonstration of correct postural strategies in order to reduce muscle imbalance and assist him in reducing pain and improving function    Time 4   Period Weeks   Status New   PT SHORT TERM GOAL #5   Title Patient will be independent in correctly and consistently performing appropriate HEP, to be updated PRN    Time 4   Period Weeks   Status New           PT Long Term Goals - 06/29/15 1532    PT LONG TERM GOAL #1   Title Patient will experience no more than 3/10 pain in his low back and R leg during all functional activities and  gait    Time 8   Period Weeks   Status New   PT LONG TERM GOAL #2   Title Patient will be able to maintain tandem stance at least 30 seconds and SLS at least 20 seconds each leg    Time 8   Period Weeks   Status New   PT LONG TERM GOAL #3   Title Patient will demonstrate at least 4/5 strength in bilateral lower extremities and at least 4-/5 in proximal muscle strength    Time 8   Period Weeks   Status New   PT LONG TERM GOAL #4   Title Patient will be able to perform standing tasks for at least 60 minutes and ambulation for at least 60 minutes with minimal fatigue and pain no more than 3/10   Time 8   Period Weeks   Status New   PT LONG TERM GOAL #5   Title Patient will improve bilateral hip ER and lumbar ROM by at least 15 degrees in order to assist with correcting posture and reducing pain    Time 8   Period Weeks   Status New               Plan - 07/14/15 1153    Clinical Impression Statement Reviewed HEP with patient displaying good form.  Pt remained anxious throughout session with apprehension to attempt new positions and exercises.  Pt unable to complete standing hamstring stretch on Lt LE due to inability to fully weight bear onto Rt LE.  Began nustep today with avg SPM of 30 and noted fatigue and sweating during actvitiy.  Educated patient on posture and benefits of sitting/standing more upright to benefit breathing, however patient reported he could not sit up straight.  Began LE stetching and core strengthening today.   Rehab Potential Fair   PT Next Visit Plan Progress mobility of spine to tolerance, continue postural education and functional stretching and strengthening as tolerated.   Begin SLS and lunges next session and instruct long seated hamstring stretch.    PT Home Exercise Plan given    Consulted and Agree with Plan of Care Patient        Problem List Patient Active Problem List   Diagnosis Date Noted  . GERD (gastroesophageal reflux disease)  06/28/2015  . COPD (chronic obstructive pulmonary disease) 06/28/2015  . GAD (generalized anxiety disorder) 06/28/2015  . Arrhythmia 06/27/2015  . BACK PAIN, LUMBAR, CHRONIC 06/29/2010  . WEIGHT LOSS 06/29/2010  . CHANGE IN BOWELS 06/29/2010  . EPIGASTRIC PAIN 06/29/2010    Teena Irani, PTA/CLT [phone]  07/14/2015, 12:03 PM  [place] [address] [place], [place], [address] Phone: [phone]   Fax:  [phone]

## 2015-07-18 ENCOUNTER — Ambulatory Visit (HOSPITAL_COMMUNITY): Payer: Medicare Other | Admitting: Physical Therapy

## 2015-07-18 DIAGNOSIS — M25652 Stiffness of left hip, not elsewhere classified: Secondary | ICD-10-CM

## 2015-07-18 DIAGNOSIS — M25651 Stiffness of right hip, not elsewhere classified: Secondary | ICD-10-CM

## 2015-07-18 DIAGNOSIS — R2681 Unsteadiness on feet: Secondary | ICD-10-CM | POA: Diagnosis not present

## 2015-07-18 DIAGNOSIS — M545 Low back pain, unspecified: Secondary | ICD-10-CM

## 2015-07-18 DIAGNOSIS — M2560 Stiffness of unspecified joint, not elsewhere classified: Secondary | ICD-10-CM

## 2015-07-18 DIAGNOSIS — R531 Weakness: Secondary | ICD-10-CM | POA: Diagnosis not present

## 2015-07-18 DIAGNOSIS — R293 Abnormal posture: Secondary | ICD-10-CM | POA: Diagnosis not present

## 2015-07-18 DIAGNOSIS — Z7409 Other reduced mobility: Secondary | ICD-10-CM

## 2015-07-18 DIAGNOSIS — M2569 Stiffness of other specified joint, not elsewhere classified: Secondary | ICD-10-CM

## 2015-07-18 NOTE — Therapy (Signed)
[place], [place], [address] Phone: [phone]   Fax:  [phone]  Physical Therapy Treatment  Patient Details  Name: Warren Miller MRN: [id number] Date of Birth: 1971/05/15 Referring Provider:  Sharion Balloon, FNP  Encounter Date: 07/18/2015      PT End of Session - 07/18/15 1109    Visit Number 3   Number of Visits 16   Date for PT Re-Evaluation 07/27/15   Authorization Type Medicare/Medicaid    Authorization Time Period 06/29/15 to 08/29/15   Authorization - Visit Number 3   Authorization - Number of Visits 10   PT Start Time 1016   PT Stop Time 1104   PT Time Calculation (min) 48 min   Activity Tolerance Patient tolerated treatment well   Behavior During Therapy [facility] for tasks assessed/performed;Anxious      Past Medical History  Diagnosis Date  . Former heavy cigarette smoker (20-39 per day)   . H. pylori infection   . Chronic abdominal pain   . GERD (gastroesophageal reflux disease)   . COPD (chronic obstructive pulmonary disease)   . Anxiety disorder     Past Surgical History  Procedure Laterality Date  . Hernia repair      x 2, inguinal, bilateral    There were no vitals filed for this visit.  Visit Diagnosis:  Midline low back pain  Back stiffness  Generalized weakness  Poor posture  Unsteadiness  Hip joint stiffness, left  Hip stiffness, right  Impaired functional mobility and activity tolerance      Subjective Assessment - 07/18/15 1106    Subjective PT reports compliance with HEP and reports continued pain of 5/10 in Lumbar spine.   Currently in Pain? Yes   Pain Score 5    Pain Location Back   Pain Orientation Lower                         [facility] Adult PT Treatment/Exercise - 07/18/15 1017    Lumbar Exercises: Stretches   Active Hamstring Stretch 3 reps;30 seconds   Active Hamstring Stretch Limitations 12" step   Lumbar Exercises: Aerobic   Stationary Bike nustep LE only 10 minutes hills #3 level #3 SPM avg at 30 SPM   Lumbar Exercises: Standing   Heel Raises 10 reps   Heel Raises Limitations toeraises 10 reps   Functional Squats 10 reps   Forward Lunge 10 reps   Forward Lunge Limitations 4" bilaterally   Other Standing Lumbar Exercises SLS 30" LT, 15" RT max of 3   Other Standing Lumbar Exercises vector LT with bilateral HHA 3X5"   Lumbar Exercises: Seated   Sit to Stand Limitations 10 reps from mat with intermittent HHA   Lumbar Exercises: Supine   Ab Set 10 reps;5 seconds   Bent Knee Raise 10 reps   Bridge 10 reps   Straight Leg Raise 10 reps                  PT Short Term Goals - 06/29/15 1527    PT SHORT TERM GOAL #1   Title Patient will experience no more than 4/10 pain in his back and R leg during all functional activity performance    Time 4   Period Weeks   Status New   PT SHORT TERM GOAL #2   Title Patient will improve gross muscle strength to at least 3+/5 in bilateral lower extremities and  proximal muscle strength to at least 3-/5    Time 4   Period Weeks   Status New   PT SHORT TERM GOAL #3   Title Patient will improve bilateral hip ER and lumbar mobility by at least 8 degrees with no increases in pain    Time 4   Period Weeks   Status New   PT SHORT TERM GOAL #4   Title Patient will be able to verbalize and return demonstration of correct postural strategies in order to reduce muscle imbalance and assist him in reducing pain and improving function    Time 4   Period Weeks   Status New   PT SHORT TERM GOAL #5   Title Patient will be independent in correctly and consistently performing appropriate HEP, to be updated PRN    Time 4   Period Weeks   Status New           PT Long Term Goals - 06/29/15 1532    PT LONG TERM GOAL #1   Title Patient will experience no more than 3/10 pain in his low back and R leg during all functional activities and gait    Time 8   Period Weeks    Status New   PT LONG TERM GOAL #2   Title Patient will be able to maintain tandem stance at least 30 seconds and SLS at least 20 seconds each leg    Time 8   Period Weeks   Status New   PT LONG TERM GOAL #3   Title Patient will demonstrate at least 4/5 strength in bilateral lower extremities and at least 4-/5 in proximal muscle strength    Time 8   Period Weeks   Status New   PT LONG TERM GOAL #4   Title Patient will be able to perform standing tasks for at least 60 minutes and ambulation for at least 60 minutes with minimal fatigue and pain no more than 3/10   Time 8   Period Weeks   Status New   PT LONG TERM GOAL #5   Title Patient will improve bilateral hip ER and lumbar ROM by at least 15 degrees in order to assist with correcting posture and reducing pain    Time 8   Period Weeks   Status New               Plan - 07/18/15 1111    Clinical Impression Statement PT continues to display general anxiety with diffiuculty relaxing non-involved musculature during therex.  Pt without gait deviations, however unable to full weight bear on Rt LE with sitt to stand and extreme diffiuculty to complete task without use of UE's.  Added lunges, squats (with noted weight shift to Lt), SLS and vector stance with LT LE.  Noted trembling in Rt LE with all exercises, but none with ambulation.  PT encouraged repeatedly to pull shoulders back and improve posture during all exericses.     PT Next Visit Plan Progress mobility of spine to tolerance, continue postural education and functional stretching and strengthening as tolerated.     PT Home Exercise Plan given    Consulted and Agree with Plan of Care Patient        Problem List Patient Active Problem List   Diagnosis Date Noted  . GERD (gastroesophageal reflux disease) 06/28/2015  . COPD (chronic obstructive pulmonary disease) 06/28/2015  . GAD (generalized anxiety disorder) 06/28/2015  . Arrhythmia 06/27/2015  . BACK PAIN, LUMBAR,  CHRONIC 06/29/2010  . WEIGHT LOSS 06/29/2010  . CHANGE IN BOWELS 06/29/2010  . EPIGASTRIC PAIN 06/29/2010    Teena Irani, PTA/CLT [phone]  07/18/2015, 11:23 AM  [place] [facility] [address] [place], [place], [address] Phone: [phone]   Fax:  [phone]

## 2015-07-21 ENCOUNTER — Encounter (HOSPITAL_COMMUNITY): Payer: Medicare Other | Admitting: Physical Therapy

## 2015-07-25 ENCOUNTER — Ambulatory Visit (HOSPITAL_COMMUNITY): Payer: Medicare Other | Admitting: Physical Therapy

## 2015-07-25 DIAGNOSIS — R293 Abnormal posture: Secondary | ICD-10-CM | POA: Diagnosis not present

## 2015-07-25 DIAGNOSIS — M545 Low back pain, unspecified: Secondary | ICD-10-CM

## 2015-07-25 DIAGNOSIS — R2681 Unsteadiness on feet: Secondary | ICD-10-CM | POA: Diagnosis not present

## 2015-07-25 DIAGNOSIS — R531 Weakness: Secondary | ICD-10-CM | POA: Diagnosis not present

## 2015-07-25 DIAGNOSIS — M2560 Stiffness of unspecified joint, not elsewhere classified: Secondary | ICD-10-CM

## 2015-07-25 DIAGNOSIS — M25652 Stiffness of left hip, not elsewhere classified: Secondary | ICD-10-CM | POA: Diagnosis not present

## 2015-07-25 DIAGNOSIS — M2569 Stiffness of other specified joint, not elsewhere classified: Secondary | ICD-10-CM

## 2015-07-25 NOTE — Therapy (Signed)
[place], [place], [address] Phone: [phone]   Fax:  [phone]  Physical Therapy Treatment  Patient Details  Name: Warren [place] MRN: [id number] Date of Birth: 26-Nov-1970 Referring Sheria Rosello:  Penni Bombard, MD  Encounter Date: 07/25/2015      PT End of Session - 07/25/15 1146    Visit Number 4   Number of Visits 16   Date for PT Re-Evaluation 07/27/15   Authorization Type Medicare/Medicaid    Authorization Time Period 06/29/15 to 08/29/15   Authorization - Visit Number 4   Authorization - Number of Visits 10   PT Start Time 5916   PT Stop Time 1101   PT Time Calculation (min) 46 min   Activity Tolerance Patient tolerated treatment well;Patient limited by pain   Behavior During Therapy [facility] for tasks assessed/performed      Past Medical History  Diagnosis Date  . Former heavy cigarette smoker (20-39 per day)   . H. pylori infection   . Chronic abdominal pain   . GERD (gastroesophageal reflux disease)   . COPD (chronic obstructive pulmonary disease)   . Anxiety disorder     Past Surgical History  Procedure Laterality Date  . Hernia repair      x 2, inguinal, bilateral    There were no vitals filed for this visit.  Visit Diagnosis:  Midline low back pain  Back stiffness  Generalized weakness  Poor posture      Subjective Assessment - 07/25/15 1018    Subjective Pt reports that he has increased LBP today.    Currently in Pain? Yes   Pain Score 6    Pain Location Back   Pain Orientation Lower                         [facility] Adult PT Treatment/Exercise - 07/25/15 0001    Lumbar Exercises: Stretches   Active Hamstring Stretch 3 reps;30 seconds   Active Hamstring Stretch Limitations 12" step   Single Knee to Chest Stretch 5 reps;10 seconds   Lower Trunk Rotation --  10 reps   Lumbar Exercises: Aerobic   Stationary Bike nustep LE only 10 minutes hills #3 level  #3 SPM avg at 33 SPM   Lumbar Exercises: Standing   Heel Raises 15 reps   Forward Lunge 10 reps   Forward Lunge Limitations 4" bilaterally   Other Standing Lumbar Exercises SLS 24" LLE, 12" RLE   Other Standing Lumbar Exercises step ups at 4" step x 10 bilat   Lumbar Exercises: Supine   Ab Set 10 reps;5 seconds   Bent Knee Raise 10 reps   Bridge 5 reps  unable to tolerate >5 reps   Manual Therapy   Manual Therapy Soft tissue mobilization   Soft tissue mobilization bilateral paraspinals, QL                  PT Short Term Goals - 06/29/15 1527    PT SHORT TERM GOAL #1   Title Patient will experience no more than 4/10 pain in his back and R leg during all functional activity performance    Time 4   Period Weeks   Status New   PT SHORT TERM GOAL #2   Title Patient will improve gross muscle strength to at least 3+/5 in bilateral lower extremities and proximal muscle strength to at least 3-/5    Time 4   Period  Weeks   Status New   PT SHORT TERM GOAL #3   Title Patient will improve bilateral hip ER and lumbar mobility by at least 8 degrees with no increases in pain    Time 4   Period Weeks   Status New   PT SHORT TERM GOAL #4   Title Patient will be able to verbalize and return demonstration of correct postural strategies in order to reduce muscle imbalance and assist him in reducing pain and improving function    Time 4   Period Weeks   Status New   PT SHORT TERM GOAL #5   Title Patient will be independent in correctly and consistently performing appropriate HEP, to be updated PRN    Time 4   Period Weeks   Status New           PT Long Term Goals - 06/29/15 1532    PT LONG TERM GOAL #1   Title Patient will experience no more than 3/10 pain in his low back and R leg during all functional activities and gait    Time 8   Period Weeks   Status New   PT LONG TERM GOAL #2   Title Patient will be able to maintain tandem stance at least 30 seconds and SLS at  least 20 seconds each leg    Time 8   Period Weeks   Status New   PT LONG TERM GOAL #3   Title Patient will demonstrate at least 4/5 strength in bilateral lower extremities and at least 4-/5 in proximal muscle strength    Time 8   Period Weeks   Status New   PT LONG TERM GOAL #4   Title Patient will be able to perform standing tasks for at least 60 minutes and ambulation for at least 60 minutes with minimal fatigue and pain no more than 3/10   Time 8   Period Weeks   Status New   PT LONG TERM GOAL #5   Title Patient will improve bilateral hip ER and lumbar ROM by at least 15 degrees in order to assist with correcting posture and reducing pain    Time 8   Period Weeks   Status New               Plan - 07/25/15 1146    Clinical Impression Statement Pt demonstrated significant guarding in today's treatment. He reported increased pain with bridging, and was only able to complete 5 repetitions. Pt c/o pain in his SI region on the R side, PT attempted to assess pt's SI mobility, however, pt did not tolerate testing positions or palpation. Pt was able to complete standing exercises with constant cueing for posture and technique.    PT Next Visit Plan Attempt to check SI again, continue with functional strengthening and stretching        Problem List Patient Active Problem List   Diagnosis Date Noted  . GERD (gastroesophageal reflux disease) 06/28/2015  . COPD (chronic obstructive pulmonary disease) 06/28/2015  . GAD (generalized anxiety disorder) 06/28/2015  . Arrhythmia 06/27/2015  . BACK PAIN, LUMBAR, CHRONIC 06/29/2010  . WEIGHT LOSS 06/29/2010  . CHANGE IN BOWELS 06/29/2010  . EPIGASTRIC PAIN 06/29/2010    Hilma Favors, PT, DPT [phone] 07/25/2015, 11:55 AM  [place], [place], [address] Phone: [phone]   Fax:  [phone]

## 2015-07-27 ENCOUNTER — Other Ambulatory Visit: Payer: Self-pay | Admitting: Family

## 2015-07-28 ENCOUNTER — Ambulatory Visit (HOSPITAL_COMMUNITY): Payer: Medicare Other | Admitting: Physical Therapy

## 2015-07-28 ENCOUNTER — Telehealth (HOSPITAL_COMMUNITY): Payer: Self-pay | Admitting: Physical Therapy

## 2015-07-28 NOTE — Telephone Encounter (Signed)
Script placed at front desk for pick up, patient informed via voicemail.

## 2015-07-28 NOTE — Telephone Encounter (Signed)
Christys pt. Last filled 06/30/15, last seen 06/28/15. Rx will print

## 2015-07-28 NOTE — Telephone Encounter (Signed)
Printed refill Caryl Pina, MD [facility] 07/28/2015, 9:29 AM

## 2015-07-28 NOTE — Telephone Encounter (Signed)
PT gets him were he can not get out of the bed

## 2015-07-29 DIAGNOSIS — W25XXXA Contact with sharp glass, initial encounter: Secondary | ICD-10-CM | POA: Diagnosis not present

## 2015-07-29 DIAGNOSIS — S61216A Laceration without foreign body of right little finger without damage to nail, initial encounter: Secondary | ICD-10-CM | POA: Diagnosis not present

## 2015-07-29 DIAGNOSIS — S61511A Laceration without foreign body of right wrist, initial encounter: Secondary | ICD-10-CM | POA: Diagnosis not present

## 2015-07-29 DIAGNOSIS — Z87891 Personal history of nicotine dependence: Secondary | ICD-10-CM | POA: Diagnosis not present

## 2015-07-29 DIAGNOSIS — Z7951 Long term (current) use of inhaled steroids: Secondary | ICD-10-CM | POA: Diagnosis not present

## 2015-07-29 DIAGNOSIS — Z23 Encounter for immunization: Secondary | ICD-10-CM | POA: Diagnosis not present

## 2015-07-29 DIAGNOSIS — Z79899 Other long term (current) drug therapy: Secondary | ICD-10-CM | POA: Diagnosis not present

## 2015-08-02 ENCOUNTER — Ambulatory Visit (HOSPITAL_COMMUNITY): Payer: Medicare Other | Attending: Diagnostic Neuroimaging

## 2015-08-02 ENCOUNTER — Telehealth (HOSPITAL_COMMUNITY): Payer: Self-pay

## 2015-08-02 NOTE — Telephone Encounter (Signed)
Son answered phone. Reports that he does not know where pt is but will give him message about missed appointment once he comes back.  11:35 AM  Etta Grandchild, PT, DPT [place] License # [id number]

## 2015-08-03 ENCOUNTER — Telehealth (HOSPITAL_COMMUNITY): Payer: Self-pay | Admitting: Physical Therapy

## 2015-08-03 NOTE — Telephone Encounter (Signed)
PHYSICAL THERAPY DISCHARGE SUMMARY  Visits from Start of Care: 4  Current functional level related to goals / functional outcomes: Unknown pt did not return to clinic   Remaining deficits: unknown   Education / Equipment: HEP  Plan: Patient agrees to discharge.  Patient goals were not met. Patient is being discharged due to not returning since the last visit.  ?????       Rayetta Humphrey, [place] CLT [phone]

## 2015-08-05 ENCOUNTER — Encounter (HOSPITAL_COMMUNITY): Payer: Medicare Other

## 2015-08-09 ENCOUNTER — Ambulatory Visit (INDEPENDENT_AMBULATORY_CARE_PROVIDER_SITE_OTHER): Payer: Medicare Other | Admitting: Family Medicine

## 2015-08-09 ENCOUNTER — Encounter: Payer: Self-pay | Admitting: Family Medicine

## 2015-08-09 VITALS — BP 111/80 | HR 96 | Temp 97.4°F | Ht 73.83 in | Wt 170.4 lb

## 2015-08-09 DIAGNOSIS — S61511D Laceration without foreign body of right wrist, subsequent encounter: Secondary | ICD-10-CM

## 2015-08-09 DIAGNOSIS — S61519A Laceration without foreign body of unspecified wrist, initial encounter: Secondary | ICD-10-CM | POA: Insufficient documentation

## 2015-08-09 MED ORDER — AMOXICILLIN-POT CLAVULANATE 875-125 MG PO TABS: 1.0000 | ORAL_TABLET | Freq: Two times a day (BID) | ORAL | Status: DC

## 2015-08-09 MED ORDER — CEPHALEXIN 500 MG PO CAPS: 500.0000 mg | ORAL_CAPSULE | Freq: Three times a day (TID) | ORAL | Status: DC

## 2015-08-09 NOTE — Patient Instructions (Signed)
Great to meet you!  We have started Keflex to be sure there is no developing infection.   If you develop worsening warmth, pain, drainage,  or spreading redness come back

## 2015-08-09 NOTE — Progress Notes (Signed)
   HPI  Patient presents today here for suture removal.  Patient was seen in [facility] on September 30 after following her last urine lacerating his right wrist.  He feels that he's been healing well. He has had overall improvement in the area with some redness developing over the last 2-3 days. Denies any pain, warmth, or fever. He denies fever, nausea, or loss of appetite.  PMH: Smoking status noted ROS: Per HPI  Objective: BP 111/80 mmHg  Pulse 96  Temp(Src) 97.4 F (36.3 C) (Oral)  Ht 6' 1.83" (1.875 m)  Wt 170 lb 6.4 oz (77.293 kg)  BMI 21.99 kg/m2 Gen: NAD, alert, cooperative with exam HEENT: NCAT CV: RRR, good S1/S2, no murmur Resp: CTABL, no wheezes, non-labored Neuro: Alert and oriented, No gross deficits Skin: Right wrist with healing laceration with staples and sutures present, these were removed and the tape patient tolerated it well. Approximately 4-5 mm surrounding erythema around the wound, worse at the proximal pole with some mild diffuse redness extending about 3 cm in each direction.  Assessment and plan:  # Right wrist laceration Sutures removed Covering for infection with Augmentin, however I believe this is only local irritation.   Meds ordered this encounter  Medications  . DISCONTD: cephALEXin (KEFLEX) 500 MG capsule    Sig: Take 1 capsule (500 mg total) by mouth 3 (three) times daily.    Dispense:  21 capsule    Refill:  0  . DISCONTD: amoxicillin-clavulanate (AUGMENTIN) 875-125 MG tablet    Sig: Take 1 tablet by mouth 2 (two) times daily.    Dispense:  20 tablet    Refill:  0  . amoxicillin-clavulanate (AUGMENTIN) 875-125 MG tablet    Sig: Take 1 tablet by mouth 2 (two) times daily.    Dispense:  14 tablet    Refill:  0    Please disregard previous antibiotic Rx, please fill 14 augmentin 875. Thanks!    Laroy Apple, MD [facility] 08/09/2015, 11:16 AM

## 2015-08-10 DIAGNOSIS — F419 Anxiety disorder, unspecified: Secondary | ICD-10-CM | POA: Diagnosis not present

## 2015-08-11 DIAGNOSIS — R0602 Shortness of breath: Secondary | ICD-10-CM | POA: Diagnosis not present

## 2015-08-11 DIAGNOSIS — Z881 Allergy status to other antibiotic agents status: Secondary | ICD-10-CM | POA: Diagnosis not present

## 2015-08-11 DIAGNOSIS — R079 Chest pain, unspecified: Secondary | ICD-10-CM | POA: Diagnosis not present

## 2015-08-11 DIAGNOSIS — Z79899 Other long term (current) drug therapy: Secondary | ICD-10-CM | POA: Diagnosis not present

## 2015-08-11 DIAGNOSIS — R0789 Other chest pain: Secondary | ICD-10-CM | POA: Diagnosis not present

## 2015-08-11 DIAGNOSIS — F17200 Nicotine dependence, unspecified, uncomplicated: Secondary | ICD-10-CM | POA: Diagnosis not present

## 2015-08-11 DIAGNOSIS — Z7951 Long term (current) use of inhaled steroids: Secondary | ICD-10-CM | POA: Diagnosis not present

## 2015-08-11 DIAGNOSIS — F410 Panic disorder [episodic paroxysmal anxiety] without agoraphobia: Secondary | ICD-10-CM | POA: Diagnosis not present

## 2015-08-11 DIAGNOSIS — F419 Anxiety disorder, unspecified: Secondary | ICD-10-CM | POA: Diagnosis not present

## 2015-08-12 DIAGNOSIS — R079 Chest pain, unspecified: Secondary | ICD-10-CM | POA: Diagnosis not present

## 2015-08-15 ENCOUNTER — Encounter: Payer: Self-pay | Admitting: Family Medicine

## 2015-08-15 ENCOUNTER — Ambulatory Visit (INDEPENDENT_AMBULATORY_CARE_PROVIDER_SITE_OTHER): Payer: Medicare Other | Admitting: Family Medicine

## 2015-08-15 VITALS — BP 119/73 | HR 127 | Temp 97.3°F | Ht 73.83 in | Wt 172.4 lb

## 2015-08-15 DIAGNOSIS — N529 Male erectile dysfunction, unspecified: Secondary | ICD-10-CM | POA: Diagnosis not present

## 2015-08-15 MED ORDER — SILDENAFIL CITRATE 20 MG PO TABS: ORAL_TABLET | ORAL | Status: DC

## 2015-08-15 NOTE — Patient Instructions (Signed)
Great to see you!  Please see [facility] as soon as possible, consider trying another depression medicine (SSRI)  for anxiety   The sildinafil will not be covered by your insurance

## 2015-08-15 NOTE — Progress Notes (Signed)
   HPI  Patient presents today  today for hospital follow-up.  He is admitted on October 13 for chest pain evaluated with a stress test while admitted which was normal except it was limited to 3 minutes due to exercise intolerance. They feel that his chest pain was due to anxiety.   Patient explains that he was seen by a cardiologist about 2 months ago who stated that his heart was "good".  Patient explains that he was recently changed from [place] to Valium by his psychiatrist, he states that the Valium is not helping as much is the Klonopin were was. He was hoping that we would change him back to Klonopin. His psychiatrist has recommended and necessary right daily and titration off of Valium. He disagrees with this plan.  He denies any suicidal thoughts, he states that his racing heart seems to happen more when he is anxious. He continues to see his psychiatrist at [facility]  He would also like to talk about medications for erectile dysfunction He states he has a difficult time maintaining erection. No current chest pain Has never tried Viagra before.  PMH: Smoking status noted ROS: Per HPI  Objective: BP 119/73 mmHg  Pulse 127  Temp(Src) 97.3 F (36.3 C) (Oral)  Ht 6' 1.83" (1.875 m)  Wt 172 lb 6.4 oz (78.2 kg)  BMI 22.24 kg/m2 Gen: NAD, alert, cooperative with exam HEENT: NCAT CV: tachy, no murmur Resp: CTABL, no wheezes, non-labored Neuro: Alert and oriented, No gross deficits Skin: helaing lac with no signs of infection Psych: nervous affect, pressures, no SI  Mood screening tools:  PHQ-9 score: 12, No SI GAD-7 Score: 16   Assessment and plan:  # GAD He has a psychiatrist currently prescribing valium Recommended close f/u with them, he is very averse to trying SSRI again with some previous side effects I declined changing to klonopin today, continue valium per psych No refill written Tachy likely related, currently no chest pain, recent cards eval and stress  test IP so unlikely cardiac etiology  # ED Revatio dosed for ED, Discussed no insurance coverage for this and buying it cash.     Meds ordered this encounter  Medications  . sildenafil (REVATIO) 20 MG tablet    Sig: Take 2-4 pills once daily as needed for erectile dsyfunction    Dispense:  20 tablet    Refill:  [place], MD [facility] 08/15/2015, 11:24 AM

## 2015-08-26 ENCOUNTER — Other Ambulatory Visit: Payer: Self-pay | Admitting: Family Medicine

## 2015-08-26 ENCOUNTER — Other Ambulatory Visit: Payer: Self-pay | Admitting: Family

## 2015-08-26 NOTE — Telephone Encounter (Signed)
Last filled 07/28/15, last seen 06/28/15. Rx will print

## 2015-08-26 NOTE — Telephone Encounter (Signed)
RX ready for pick up 

## 2015-09-07 ENCOUNTER — Encounter: Payer: Self-pay | Admitting: Diagnostic Neuroimaging

## 2015-09-07 ENCOUNTER — Ambulatory Visit (INDEPENDENT_AMBULATORY_CARE_PROVIDER_SITE_OTHER): Payer: Medicare Other | Admitting: Diagnostic Neuroimaging

## 2015-09-07 VITALS — BP 131/79 | HR 82 | Ht 73.0 in | Wt 174.0 lb

## 2015-09-07 DIAGNOSIS — R29898 Other symptoms and signs involving the musculoskeletal system: Secondary | ICD-10-CM

## 2015-09-07 DIAGNOSIS — M5441 Lumbago with sciatica, right side: Secondary | ICD-10-CM | POA: Diagnosis not present

## 2015-09-07 NOTE — Patient Instructions (Signed)
Thank you for coming to see [place] at [facility]. I hope we have been able to provide you high quality care today.  You may receive a patient satisfaction survey over the next few weeks. We would appreciate your feedback and comments so that we may continue to improve ourselves and the health of our patients.  - follow up with general medical clinic and pain clinic.   ~~~~~~~~~~~~~~~~~~~~~~~~~~~~~~~~~~~~~~~~~~~~~~~~~~~~~~~~~~~~~~~~~  DR. PENUMALLI'S GUIDE TO HAPPY AND HEALTHY LIVING These are some of my general health and wellness recommendations. Some of them may apply to you better than others. Please use common sense as you try these suggestions and feel free to ask me any questions.   ACTIVITY/FITNESS Mental, social, emotional and physical stimulation are very important for brain and body health. Try learning a new activity (arts, music, language, sports, games).  Keep moving your body to the best of your abilities. You can do this at home, inside or outside, the park, community center, gym or anywhere you like. Consider a physical therapist or personal trainer to get started. Consider the app Sworkit. Fitness trackers such as smart-watches, smart-phones or Fitbits can help as well.   NUTRITION Eat more plants: colorful vegetables, nuts, seeds and berries.  Eat less sugar, salt, preservatives and processed foods.  Avoid toxins such as cigarettes and alcohol.  Drink water when you are thirsty. Warm water with a slice of lemon is an excellent morning drink to start the day.  Consider these websites for more information The Nutrition Source (https://www.henry-hernandez.biz/) Precision Nutrition (WindowBlog.ch)   RELAXATION Consider practicing mindfulness meditation or other relaxation techniques such as deep breathing, prayer, yoga, tai chi, massage. See website mindful.org or the apps Headspace or Calm to help get  started.   SLEEP Try to get at least 7-8+ hours sleep per day. Regular exercise and reduced caffeine will help you sleep better. Practice good sleep hygeine techniques. See website sleep.org for more information.   PLANNING Prepare estate planning, living will, healthcare POA documents. Sometimes this is best planned with the help of an attorney. Theconversationproject.org and agingwithdignity.org are excellent resources.

## 2015-09-07 NOTE — Progress Notes (Signed)
[facility]  PATIENT: Warren [place] DOB: 1971-05-29  REFERRING CLINICIAN: Ramos HISTORY FROM: patient  REASON FOR VISIT: new consult    HISTORICAL  CHIEF COMPLAINT:  Chief Complaint  Patient presents with  . Pain    rm 7  . Follow-up    3 month    HISTORY OF PRESENT ILLNESS:   UPDATE 09/07/15: Since last visit, MRI c and t spine unremarkable, labs unremarkable, and cardiology eval unremarkable. Still with low back pain and right leg weakness. Struggling with anxiety and panic attacks. Seeing [facility] in [place].  PRIOR HPI (06/01/15): 44 year old right-handed male here for evaluation of right leg weakness. 2011 patient fell at work, twisted his right leg, had sharp shooting pain in his back radiating to the right leg. Since that time he has had persistent low back pain and right leg problems. Recently he went to pain management/orthopedic clinic, had repeat MRI of the lumbar spine which showed mild degenerative changes, but not enough to significantly explain patient's degree of right leg pain and right leg weakness. Patient referred to me for further evaluation. No significant problems with the left leg. No problems with his left arm. He has some intermittent right arm numbness. He has some mild neck pain. On exam today patient noted to have some irregularly irregular heart rhythm. Patient reports family history of atrial fibrillation in his father and first cousin.   REVIEW OF SYSTEMS: Full 14 system review of systems performed and notable only for weight loss fatigue chest pain blurred vision turns of breath cough wheezing feeling hot and pain runny nose frequent infection anxiety not asleep disinterest in activities restless legs confusion headache weakness.  ALLERGIES: Allergies  Allergen Reactions  . Other Shortness Of Breath    Patient states he had a reaction (shortness of breath) after receiving an antibiotic for a H-pylori  infection in the  past. Name of medication unknown. Patient can take AMOXICILLIN.  Marland Kitchen Tetracyclines & Related     Patient thinks this is medication that he is allergic to    HOME MEDICATIONS: Outpatient Prescriptions Prior to Visit  Medication Sig Dispense Refill  . cyclobenzaprine (FLEXERIL) 10 MG tablet TAKE ONE TABLET BY MOUTH THREE TIMES DAILY AS NEEDED FOR MUSCLE SPASMS 30 tablet 1  . diazepam (VALIUM) 5 MG tablet Take 5 mg by mouth. Doctor at [facility] is adjusting how often for patient to take.    . fluticasone (FLONASE) 50 MCG/ACT nasal spray Place 2 sprays into both nostrils 2 (two) times daily as needed.    Marland Kitchen omeprazole (PRILOSEC) 20 MG capsule Take 1 capsule (20 mg total) by mouth daily. 30 capsule 11  . sildenafil (REVATIO) 20 MG tablet Take 2-4 pills once daily as needed for erectile dsyfunction 20 tablet 1  . SYMBICORT 160-4.5 MCG/ACT inhaler INHALE 2 PUFFS INTO THE LUNGS TWICE DAILY 10.2 g 3  . traMADol (ULTRAM) 50 MG tablet TAKE TWO TABLETS BY MOUTH EVERY 12 HOURS AS NEEDED 90 tablet 0  . albuterol (PROVENTIL HFA;VENTOLIN HFA) 108 (90 BASE) MCG/ACT inhaler Inhale 2 puffs into the lungs every 6 (six) hours as needed for wheezing or shortness of breath. (Patient not taking: Reported on 09/07/2015) 1 Inhaler 11   Facility-Administered Medications Prior to Visit  Medication Dose Route Frequency Provider Last Rate Last Dose  . levalbuterol (XOPENEX) nebulizer solution 1.25 mg  1.25 mg Nebulization Once Lysbeth Penner, FNP        PAST MEDICAL HISTORY: Past Medical History  Diagnosis Date  . Former heavy cigarette smoker (20-39 per day)   . H. pylori infection   . Chronic abdominal pain   . GERD (gastroesophageal reflux disease)   . COPD (chronic obstructive pulmonary disease) ([place])   . Anxiety disorder     PAST SURGICAL HISTORY: Past Surgical History  Procedure Laterality Date  . Hernia repair      x 2, inguinal, bilateral    FAMILY HISTORY: Family History  Problem Relation Age of  Onset  . Liver disease Mother   . Alcoholism Mother     SOCIAL HISTORY:  Social History   Social History  . Marital Status: Married    Spouse Name: Kenney Houseman  . Number of Children: 2  . Years of Education: 12   Occupational History  . disabled    Social History Main Topics  . Smoking status: Former Smoker -- 1.50 packs/day    Types: Cigarettes    Quit date: 09/18/2004  . Smokeless tobacco: Current User    Types: Chew     Comment: 2 or more cans smokeless per week, 09/07/15 smoking 4-5 cigs daily  . Alcohol Use: No  . Drug Use: No  . Sexual Activity: Yes    Birth Control/ Protection: None   Other Topics Concern  . Not on file   Social History Narrative   Lives with spouse , 2 children   Caffeine use- none to rare     PHYSICAL EXAM  GENERAL EXAM/CONSTITUTIONAL: Vitals:  Filed Vitals:   09/07/15 1037  BP: 131/79  Pulse: 82  Height: 6\' 1"  (1.854 m)  Weight: 174 lb (78.926 kg)   Body mass index is 22.96 kg/(m^2). No exam data present  Patient is in no distress; well developed, nourished and groomed; neck is supple  APPEARS OLDER THAN STATED AGE  CARDIOVASCULAR:  Examination of carotid arteries is normal; no carotid bruits  REGULAR RHYTHM, no murmurs  Examination of peripheral vascular system by observation and palpation is normal  EYES:  Ophthalmoscopic exam of optic discs and posterior segments is normal; no papilledema or hemorrhages  MUSCULOSKELETAL:  Gait, strength, tone, movements noted in Neurologic exam below  NEUROLOGIC: MENTAL STATUS:  No flowsheet data found.  awake, alert, oriented to person, place and time  recent and remote memory intact  normal attention and concentration  language fluent, comprehension intact, naming intact,   fund of knowledge appropriate  CRANIAL NERVE:   2nd - no papilledema on fundoscopic exam  2nd, 3rd, 4th, 6th - pupils equal and reactive to light, visual fields full to confrontation, extraocular  muscles intact, no nystagmus  5th - facial sensation symmetric  7th - facial strength symmetric  8th - hearing intact  9th - palate elevates symmetrically, uvula midline  11th - shoulder shrug symmetric  12th - tongue protrusion midline  MOTOR:   normal bulk and tone, full strength in the BUE, LLE; RLE HF 4, KE/KF 4, DF 3  SENSORY:   normal and symmetric to light touch, pinprick, temperature, vibration; SLIGHTLY DECR TEMP IN LEFT FOOT  COORDINATION:   finger-nose-finger, fine finger movements normal  REFLEXES:   deep tendon reflexes present and symmetric; BUE 2; KNEES 3; ANKLES TRACE; TOES MUTE  GAIT/STATION:   ANTALGIC GAIT; LIMPS ON RIGHT LEG    DIAGNOSTIC DATA (LABS, IMAGING, TESTING) - I reviewed patient records, labs, notes, testing and imaging myself where available.  Lab Results  Component Value Date   WBC 4.1* 07/02/2014   HGB 16.1 07/02/2014  HCT 47.5 07/02/2014   MCV 87.3 07/02/2014   PLT 180 03/10/2014      Component Value Date/Time   NA 142 06/28/2015 1317   NA 141 03/10/2014 1445   K 4.2 06/28/2015 1317   CL 100 06/28/2015 1317   CO2 25 06/28/2015 1317   GLUCOSE 86 06/28/2015 1317   GLUCOSE 100* 03/10/2014 1445   BUN 7 06/28/2015 1317   BUN 11 03/10/2014 1445   CREATININE 0.99 06/28/2015 1317   CALCIUM 9.2 06/28/2015 1317   PROT 6.9 06/28/2015 1317   PROT 6.9 03/15/2014 1050   ALBUMIN 4.2 06/28/2015 1317   ALBUMIN 4.2 03/15/2014 1050   AST 18 06/28/2015 1317   ALT 16 06/28/2015 1317   ALKPHOS 86 06/28/2015 1317   BILITOT 0.5 06/28/2015 1317   BILITOT 0.6 07/02/2014 1153   GFRNONAA 91 06/28/2015 1317   GFRAA 105 06/28/2015 1317   Lab Results  Component Value Date   CHOL 170 07/02/2014   HDL 47 07/02/2014   LDLCALC 111* 07/02/2014   TRIG 59 07/02/2014   CHOLHDL 3.6 07/02/2014   Lab Results  Component Value Date   HGBA1C 5.6 06/01/2015   Lab Results  Component Value Date   KGYJEHUD14 970 06/01/2015   Lab Results    Component Value Date   TSH 1.670 06/01/2015    03/30/15 MRI lumbar spine (report only) - At L4-5: tiny protusion and disc bulging with slightly affect right L4 ganglion  06/15/15 MRI cervical - Unremarkable MRI cervical spine (without). No spinal stenosis or foraminal narrowing. No intrinsic or compressive spinal cord lesions.  06/15/15 MRI thoracic spine - normal     ASSESSMENT AND PLAN  44 y.o. 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   Ddx back pain and right leg weakness: musculoskeletal strain, facet disease, lumbar radiculopathy  Ddx hyperreflexia in legs: anxiety state  Ddx irreg heart beat: sinus arrhythmia   Visit diagnoses:  Bilateral low back pain with right-sided sciatica  Right leg weakness    PLAN: - return to PCP and pain mgmt  No Follow-up on file.    Penni Bombard, MD 26/12/7856, 85:02 AM Certified in Neurology, Neurophysiology and Neuroimaging  [facility] [address] [place], [place] [address] [phone]

## 2015-09-26 ENCOUNTER — Telehealth: Payer: Self-pay | Admitting: Family

## 2015-11-02 DIAGNOSIS — F419 Anxiety disorder, unspecified: Secondary | ICD-10-CM | POA: Diagnosis not present

## 2016-01-27 DIAGNOSIS — F419 Anxiety disorder, unspecified: Secondary | ICD-10-CM | POA: Diagnosis not present

## 2016-02-22 ENCOUNTER — Emergency Department (HOSPITAL_COMMUNITY)
Admission: EM | Admit: 2016-02-22 | Discharge: 2016-02-22 | Disposition: A | Discharge status: Home / Self Care | Payer: Medicare Other | Attending: Emergency Medicine | Admitting: Emergency Medicine

## 2016-02-22 ENCOUNTER — Encounter (HOSPITAL_COMMUNITY): Payer: Self-pay

## 2016-02-22 ENCOUNTER — Emergency Department (HOSPITAL_COMMUNITY): Payer: Medicare Other

## 2016-02-22 DIAGNOSIS — J40 Bronchitis, not specified as acute or chronic: Secondary | ICD-10-CM | POA: Insufficient documentation

## 2016-02-22 DIAGNOSIS — R062 Wheezing: Secondary | ICD-10-CM | POA: Insufficient documentation

## 2016-02-22 DIAGNOSIS — J449 Chronic obstructive pulmonary disease, unspecified: Secondary | ICD-10-CM | POA: Diagnosis not present

## 2016-02-22 DIAGNOSIS — F17210 Nicotine dependence, cigarettes, uncomplicated: Secondary | ICD-10-CM | POA: Insufficient documentation

## 2016-02-22 DIAGNOSIS — R05 Cough: Secondary | ICD-10-CM | POA: Diagnosis present

## 2016-02-22 DIAGNOSIS — R0602 Shortness of breath: Secondary | ICD-10-CM | POA: Diagnosis not present

## 2016-02-22 MED ORDER — DEXAMETHASONE 4 MG PO TABS
12.0000 mg | ORAL_TABLET | Freq: Once | ORAL | Status: AC
  Administered 2016-02-22: 12 mg via ORAL
  Filled 2016-02-22: qty 3

## 2016-02-22 MED ORDER — GUAIFENESIN-CODEINE 100-10 MG/5ML PO SOLN
5.0000 mL | Freq: Once | ORAL | Status: AC
  Administered 2016-02-22: 5 mL via ORAL
  Filled 2016-02-22: qty 5

## 2016-02-22 MED ORDER — ALBUTEROL SULFATE HFA 108 (90 BASE) MCG/ACT IN AERS
2.0000 | INHALATION_SPRAY | Freq: Once | RESPIRATORY_TRACT | Status: AC
  Administered 2016-02-22: 2 via RESPIRATORY_TRACT
  Filled 2016-02-22: qty 6.7

## 2016-02-22 NOTE — ED Notes (Signed)
Patient with no complaints at this time. Respirations even and unlabored. Skin warm/dry. Discharge instructions reviewed with patient at this time. Patient given opportunity to voice concerns/ask questions. Patient discharged at this time and left Emergency Department with steady gait.   

## 2016-02-22 NOTE — ED Notes (Signed)
Patient reports of coughing over the last few weeks. States he feels like he cant breath good. Reports of being under a lot of stress.

## 2016-02-29 NOTE — ED Provider Notes (Signed)
CSN: [id number]     Arrival date & time 02/22/16  1455 History   First MD Initiated Contact with Patient 02/22/16 1511     Chief Complaint  Patient presents with  . Cough     (Consider location/radiation/quality/duration/timing/severity/associated sxs/prior Treatment) Patient is a 45 y.o. male presenting with cough. The history is provided by the patient.  Cough Cough characteristics:  Non-productive Severity:  Moderate Onset quality:  Gradual Timing:  Intermittent Progression:  Unchanged Chronicity:  New Smoker: yes   Relieved by:  None tried Ineffective treatments:  None tried Associated symptoms: shortness of breath   Associated symptoms: no chest pain, no chills, no fever, no rhinorrhea, no sore throat and no weight loss   Risk factors: no chemical exposure and no recent travel     Past Medical History  Diagnosis Date  . Former heavy cigarette smoker (20-39 per day)   . H. pylori infection   . Chronic abdominal pain   . GERD (gastroesophageal reflux disease)   . COPD (chronic obstructive pulmonary disease) ([place])   . Anxiety disorder    Past Surgical History  Procedure Laterality Date  . Hernia repair      x 2, inguinal, bilateral   Family History  Problem Relation Age of Onset  . Liver disease Mother   . Alcoholism Mother    Social History  Substance Use Topics  . Smoking status: Current Every Day Smoker -- 0.50 packs/day    Types: Cigarettes    Last Attempt to Quit: 09/18/2004  . Smokeless tobacco: Current User    Types: Chew     Comment: 2 or more cans smokeless per week, 09/07/15 smoking 4-5 cigs daily  . Alcohol Use: No    Review of Systems  Constitutional: Negative for fever, chills and weight loss.  HENT: Negative for rhinorrhea and sore throat.   Respiratory: Positive for cough and shortness of breath.   Cardiovascular: Negative for chest pain.      Allergies  Other and Tetracyclines & related  Home Medications   Prior to Admission  medications   Medication Sig Start Date End Date Taking? Authorizing Provider  albuterol (PROVENTIL HFA;VENTOLIN HFA) 108 (90 BASE) MCG/ACT inhaler Inhale 2 puffs into the lungs every 6 (six) hours as needed for wheezing or shortness of breath. Patient not taking: Reported on 09/07/2015 07/19/14   Lysbeth Penner, FNP  cyclobenzaprine (FLEXERIL) 10 MG tablet TAKE ONE TABLET BY MOUTH THREE TIMES DAILY AS NEEDED FOR MUSCLE SPASMS 05/20/15   Sharion Balloon, FNP  diazepam (VALIUM) 5 MG tablet Take 5 mg by mouth. Doctor at [facility] is adjusting how often for patient to take. 03/10/15   Historical Provider, MD  fluticasone (FLONASE) 50 MCG/ACT nasal spray Place 2 sprays into both nostrils 2 (two) times daily as needed. 03/04/14   Historical Provider, MD  omeprazole (PRILOSEC) 20 MG capsule Take 1 capsule (20 mg total) by mouth daily. 07/02/14   Lysbeth Penner, FNP  sildenafil (REVATIO) 20 MG tablet Take 2-4 pills once daily as needed for erectile dsyfunction 08/15/15   Timmothy Euler, MD  SYMBICORT 160-4.5 MCG/ACT inhaler INHALE 2 PUFFS INTO THE LUNGS TWICE DAILY 08/26/15   Sharion Balloon, FNP  traMADol (ULTRAM) 50 MG tablet TAKE TWO TABLETS BY MOUTH EVERY 12 HOURS AS NEEDED 08/26/15   Sharion Balloon, FNP   BP 122/87 mmHg  Pulse 78  Temp(Src) 98.2 F (36.8 C) (Oral)  Resp 13  Ht 6\' 1"  (1.854 m)  Wt 178 lb (80.74 kg)  BMI 23.49 kg/m2  SpO2 100% Physical Exam  Constitutional: He appears well-developed and well-nourished. No distress.  HENT:  Head: Normocephalic and atraumatic.  Eyes: Conjunctivae are normal. Right eye exhibits no discharge. Left eye exhibits no discharge.  Neck: Neck supple.  Cardiovascular: Normal rate, regular rhythm and normal heart sounds.  Exam reveals no gallop and no friction rub.   No murmur heard. Pulmonary/Chest: Effort normal. No respiratory distress. He has wheezes.  Abdominal: Soft. He exhibits no distension. There is no tenderness.  Musculoskeletal: He exhibits  no edema or tenderness.  Lower extremities symmetric as compared to each other. No calf tenderness. Negative Homan's. No palpable cords.   Neurological: He is alert.  Skin: Skin is warm and dry.  Psychiatric: He has a normal mood and affect. His behavior is normal. Thought content normal.  Nursing note and vitals reviewed.   ED Course  Procedures (including critical care time) Labs Review Labs Reviewed - No data to display  Imaging Review No results found. I have personally reviewed and evaluated these images and lab results as part of my medical decision-making.   EKG Interpretation None      MDM   Final diagnoses:  Bronchitis    Likely viral bronchitis. Exam overall reassuring. Doubt pneumonia,PE orother serious process.     Warren Manifold, MD 02/29/16 1343

## 2016-04-16 ENCOUNTER — Telehealth: Payer: Self-pay | Admitting: Family

## 2016-05-12 ENCOUNTER — Other Ambulatory Visit: Payer: Self-pay | Admitting: Family

## 2016-05-17 DIAGNOSIS — F419 Anxiety disorder, unspecified: Secondary | ICD-10-CM | POA: Diagnosis not present

## 2016-08-28 ENCOUNTER — Encounter: Payer: Self-pay | Admitting: Family

## 2016-08-28 ENCOUNTER — Ambulatory Visit (INDEPENDENT_AMBULATORY_CARE_PROVIDER_SITE_OTHER): Payer: Medicare Other | Admitting: Family

## 2016-08-28 VITALS — BP 109/74 | HR 83 | Temp 97.0°F | Ht 73.0 in | Wt 174.6 lb

## 2016-08-28 DIAGNOSIS — M5441 Lumbago with sciatica, right side: Secondary | ICD-10-CM | POA: Diagnosis not present

## 2016-08-28 MED ORDER — TRAMADOL HCL 50 MG PO TABS: 100.0000 mg | ORAL_TABLET | Freq: Two times a day (BID) | ORAL | 2 refills | Status: DC | PRN

## 2016-08-28 MED ORDER — NAPROXEN 500 MG PO TABS: 500.0000 mg | ORAL_TABLET | Freq: Two times a day (BID) | ORAL | 1 refills | Status: DC

## 2016-08-28 MED ORDER — PREDNISONE 10 MG (21) PO TBPK
ORAL_TABLET | ORAL | 0 refills | Status: DC
Start: 2016-08-28 — End: 2017-05-29

## 2016-08-28 NOTE — Progress Notes (Signed)
   Subjective:    Patient ID: Warren Miller, male    DOB: September 03, 1971, 45 y.o.   MRN: [id number]  Pt presents to the office today with complaints of chronic back pain.   Back Pain  This is a chronic problem. The current episode started more than 1 year ago. The problem occurs constantly. The problem has been gradually worsening since onset. The pain is present in the lumbar spine. The quality of the pain is described as aching. The pain does not radiate. The pain is at a severity of 6/10. The pain is moderate. The symptoms are aggravated by bending and twisting. Associated symptoms include leg pain, numbness and tingling. Pertinent negatives include no bladder incontinence, bowel incontinence or dysuria. He has tried analgesics, bed rest and NSAIDs for the symptoms. The treatment provided mild relief.      Review of Systems  Gastrointestinal: Negative for bowel incontinence.  Genitourinary: Negative for bladder incontinence and dysuria.  Musculoskeletal: Positive for back pain.  Neurological: Positive for tingling and numbness.  All other systems reviewed and are negative.      Objective:   Physical Exam  Constitutional: He is oriented to person, place, and time. He appears well-developed and well-nourished. No distress.  HENT:  Head: Normocephalic.  Cardiovascular: Normal rate, regular rhythm, normal heart sounds and intact distal pulses.   No murmur heard. Pulmonary/Chest: Effort normal and breath sounds normal. No respiratory distress. He has no wheezes.  Abdominal: Soft. Bowel sounds are normal. He exhibits no distension. There is no tenderness.  Musculoskeletal: He exhibits no edema or tenderness.  Positive straight leg raise   Neurological: He is alert and oriented to person, place, and time.  Skin: Skin is warm and dry. No rash noted. No erythema.  Psychiatric: He has a normal mood and affect. His behavior is normal. Judgment and thought content normal.  Vitals  reviewed.     BP 109/74   Pulse 83   Temp 97 F (36.1 C) (Oral)   Ht 6\' 1"  (1.854 m)   Wt 174 lb 9.6 oz (79.2 kg)   BMI 23.04 kg/m      Assessment & Plan:  1. Acute bilateral low back pain with right-sided sciatica -ROM exercises discussed- Handout given -Ice and heat as needed -Rest -RTO prn  - predniSONE (STERAPRED UNI-PAK 21 TAB) 10 MG (21) TBPK tablet; Use as directed  Dispense: 21 tablet; Refill: 0 - naproxen (NAPROSYN) 500 MG tablet; Take 1 tablet (500 mg total) by mouth 2 (two) times daily with a meal.  Dispense: 60 tablet; Refill: 1 - traMADol (ULTRAM) 50 MG tablet; Take 2 tablets (100 mg total) by mouth every 12 (twelve) hours as needed.  Dispense: 120 tablet; Refill: [place], FNP

## 2016-08-28 NOTE — Patient Instructions (Signed)
Sciatica With Rehab The sciatic nerve runs from the back down the leg and is responsible for sensation and control of the muscles in the back (posterior) side of the thigh, lower leg, and foot. Sciatica is a condition that is characterized by inflammation of this nerve.  SYMPTOMS   Signs of nerve damage, including numbness and/or weakness along the posterior side of the lower extremity.  Pain in the back of the thigh that may also travel down the leg.  Pain that worsens when sitting for long periods of time.  Occasionally, pain in the back or buttock. CAUSES  Inflammation of the sciatic nerve is the cause of sciatica. The inflammation is due to something irritating the nerve. Common sources of irritation include:  Sitting for long periods of time.  Direct trauma to the nerve.  Arthritis of the spine.  Herniated or ruptured disk.  Slipping of the vertebrae (spondylolisthesis).  Pressure from soft tissues, such as muscles or ligament-like tissue (fascia). RISK INCREASES WITH:  Sports that place pressure or stress on the spine (football or weightlifting).  Poor strength and flexibility.  Failure to warm up properly before activity.  Family history of low back pain or disk disorders.  Previous back injury or surgery.  Poor body mechanics, especially when lifting, or poor posture. PREVENTION   Warm up and stretch properly before activity.  Maintain physical fitness:  Strength, flexibility, and endurance.  Cardiovascular fitness.  Learn and use proper technique, especially with posture and lifting. When possible, have coach correct improper technique.  Avoid activities that place stress on the spine. PROGNOSIS If treated properly, then sciatica usually resolves within 6 weeks. However, occasionally surgery is necessary.  RELATED COMPLICATIONS   Permanent nerve damage, including pain, numbness, tingle, or weakness.  Chronic back pain.  Risks of surgery: infection,  bleeding, nerve damage, or damage to surrounding tissues. TREATMENT Treatment initially involves resting from any activities that aggravate your symptoms. The use of ice and medication may help reduce pain and inflammation. The use of strengthening and stretching exercises may help reduce pain with activity. These exercises may be performed at home or with referral to a therapist. A therapist may recommend further treatments, such as transcutaneous electronic nerve stimulation (TENS) or ultrasound. Your caregiver may recommend corticosteroid injections to help reduce inflammation of the sciatic nerve. If symptoms persist despite non-surgical (conservative) treatment, then surgery may be recommended. MEDICATION  If pain medication is necessary, then nonsteroidal anti-inflammatory medications, such as aspirin and ibuprofen, or other minor pain relievers, such as acetaminophen, are often recommended.  Do not take pain medication for 7 days before surgery.  Prescription pain relievers may be given if deemed necessary by your caregiver. Use only as directed and only as much as you need.  Ointments applied to the skin may be helpful.  Corticosteroid injections may be given by your caregiver. These injections should be reserved for the most serious cases, because they may only be given a certain number of times. HEAT AND COLD  Cold treatment (icing) relieves pain and reduces inflammation. Cold treatment should be applied for 10 to 15 minutes every 2 to 3 hours for inflammation and pain and immediately after any activity that aggravates your symptoms. Use ice packs or massage the area with a piece of ice (ice massage).  Heat treatment may be used prior to performing the stretching and strengthening activities prescribed by your caregiver, physical therapist, or athletic trainer. Use a heat pack or soak the injury in warm water.   SEEK MEDICAL CARE IF:  Treatment seems to offer no benefit, or the condition  worsens.  Any medications produce adverse side effects. EXERCISES  RANGE OF MOTION (ROM) AND STRETCHING EXERCISES - Sciatica Most people with sciatic will find that their symptoms worsen with either excessive bending forward (flexion) or arching at the low back (extension). The exercises which will help resolve your symptoms will focus on the opposite motion. Your physician, physical therapist or athletic trainer will help you determine which exercises will be most helpful to resolve your low back pain. Do not complete any exercises without first consulting with your clinician. Discontinue any exercises which worsen your symptoms until you speak to your clinician. If you have pain, numbness or tingling which travels down into your buttocks, leg or foot, the goal of the therapy is for these symptoms to move closer to your back and eventually resolve. Occasionally, these leg symptoms will get better, but your low back pain may worsen; this is typically an indication of progress in your rehabilitation. Be certain to be very alert to any changes in your symptoms and the activities in which you participated in the 24 hours prior to the change. Sharing this information with your clinician will allow him/her to most efficiently treat your condition. These exercises may help you when beginning to rehabilitate your injury. Your symptoms may resolve with or without further involvement from your physician, physical therapist or athletic trainer. While completing these exercises, remember:   Restoring tissue flexibility helps normal motion to return to the joints. This allows healthier, less painful movement and activity.  An effective stretch should be held for at least 30 seconds.  A stretch should never be painful. You should only feel a gentle lengthening or release in the stretched tissue. FLEXION RANGE OF MOTION AND STRETCHING EXERCISES: STRETCH - Flexion, Single Knee to Chest   Lie on a firm bed or floor  with both legs extended in front of you.  Keeping one leg in contact with the floor, bring your opposite knee to your chest. Hold your leg in place by either grabbing behind your thigh or at your knee.  Pull until you feel a gentle stretch in your low back. Hold __________ seconds.  Slowly release your grasp and repeat the exercise with the opposite side. Repeat __________ times. Complete this exercise __________ times per day.  STRETCH - Flexion, Double Knee to Chest  Lie on a firm bed or floor with both legs extended in front of you.  Keeping one leg in contact with the floor, bring your opposite knee to your chest.  Tense your stomach muscles to support your back and then lift your other knee to your chest. Hold your legs in place by either grabbing behind your thighs or at your knees.  Pull both knees toward your chest until you feel a gentle stretch in your low back. Hold __________ seconds.  Tense your stomach muscles and slowly return one leg at a time to the floor. Repeat __________ times. Complete this exercise __________ times per day.  STRETCH - Low Trunk Rotation   Lie on a firm bed or floor. Keeping your legs in front of you, bend your knees so they are both pointed toward the ceiling and your feet are flat on the floor.  Extend your arms out to the side. This will stabilize your upper body by keeping your shoulders in contact with the floor.  Gently and slowly drop both knees together to one side until   you feel a gentle stretch in your low back. Hold for __________ seconds.  Tense your stomach muscles to support your low back as you bring your knees back to the starting position. Repeat the exercise to the other side. Repeat __________ times. Complete this exercise __________ times per day  EXTENSION RANGE OF MOTION AND FLEXIBILITY EXERCISES: STRETCH - Extension, Prone on Elbows  Lie on your stomach on the floor, a bed will be too soft. Place your palms about shoulder  width apart and at the height of your head.  Place your elbows under your shoulders. If this is too painful, stack pillows under your chest.  Allow your body to relax so that your hips drop lower and make contact more completely with the floor.  Hold this position for __________ seconds.  Slowly return to lying flat on the floor. Repeat __________ times. Complete this exercise __________ times per day.  RANGE OF MOTION - Extension, Prone Press Ups  Lie on your stomach on the floor, a bed will be too soft. Place your palms about shoulder width apart and at the height of your head.  Keeping your back as relaxed as possible, slowly straighten your elbows while keeping your hips on the floor. You may adjust the placement of your hands to maximize your comfort. As you gain motion, your hands will come more underneath your shoulders.  Hold this position __________ seconds.  Slowly return to lying flat on the floor. Repeat __________ times. Complete this exercise __________ times per day.  STRENGTHENING EXERCISES - Sciatica  These exercises may help you when beginning to rehabilitate your injury. These exercises should be done near your "sweet spot." This is the neutral, low-back arch, somewhere between fully rounded and fully arched, that is your least painful position. When performed in this safe range of motion, these exercises can be used for people who have either a flexion or extension based injury. These exercises may resolve your symptoms with or without further involvement from your physician, physical therapist or athletic trainer. While completing these exercises, remember:   Muscles can gain both the endurance and the strength needed for everyday activities through controlled exercises.  Complete these exercises as instructed by your physician, physical therapist or athletic trainer. Progress with the resistance and repetition exercises only as your caregiver advises.  You may  experience muscle soreness or fatigue, but the pain or discomfort you are trying to eliminate should never worsen during these exercises. If this pain does worsen, stop and make certain you are following the directions exactly. If the pain is still present after adjustments, discontinue the exercise until you can discuss the trouble with your clinician. STRENGTHENING - Deep Abdominals, Pelvic Tilt   Lie on a firm bed or floor. Keeping your legs in front of you, bend your knees so they are both pointed toward the ceiling and your feet are flat on the floor.  Tense your lower abdominal muscles to press your low back into the floor. This motion will rotate your pelvis so that your tail bone is scooping upwards rather than pointing at your feet or into the floor.  With a gentle tension and even breathing, hold this position for __________ seconds. Repeat __________ times. Complete this exercise __________ times per day.  STRENGTHENING - Abdominals, Crunches   Lie on a firm bed or floor. Keeping your legs in front of you, bend your knees so they are both pointed toward the ceiling and your feet are flat on the   floor. Cross your arms over your chest.  Slightly tip your chin down without bending your neck.  Tense your abdominals and slowly lift your trunk high enough to just clear your shoulder blades. Lifting higher can put excessive stress on the low back and does not further strengthen your abdominal muscles.  Control your return to the starting position. Repeat __________ times. Complete this exercise __________ times per day.  STRENGTHENING - Quadruped, Opposite UE/LE Lift  Assume a hands and knees position on a firm surface. Keep your hands under your shoulders and your knees under your hips. You may place padding under your knees for comfort.  Find your neutral spine and gently tense your abdominal muscles so that you can maintain this position. Your shoulders and hips should form a rectangle  that is parallel with the floor and is not twisted.  Keeping your trunk steady, lift your right hand no higher than your shoulder and then your left leg no higher than your hip. Make sure you are not holding your breath. Hold this position __________ seconds.  Continuing to keep your abdominal muscles tense and your back steady, slowly return to your starting position. Repeat with the opposite arm and leg. Repeat __________ times. Complete this exercise __________ times per day.  STRENGTHENING - Abdominals and Quadriceps, Straight Leg Raise   Lie on a firm bed or floor with both legs extended in front of you.  Keeping one leg in contact with the floor, bend the other knee so that your foot can rest flat on the floor.  Find your neutral spine, and tense your abdominal muscles to maintain your spinal position throughout the exercise.  Slowly lift your straight leg off the floor about 6 inches for a count of 15, making sure to not hold your breath.  Still keeping your neutral spine, slowly lower your leg all the way to the floor. Repeat this exercise with each leg __________ times. Complete this exercise __________ times per day. POSTURE AND BODY MECHANICS CONSIDERATIONS - Sciatica Keeping correct posture when sitting, standing or completing your activities will reduce the stress put on different body tissues, allowing injured tissues a chance to heal and limiting painful experiences. The following are general guidelines for improved posture. Your physician or physical therapist will provide you with any instructions specific to your needs. While reading these guidelines, remember:  The exercises prescribed by your provider will help you have the flexibility and strength to maintain correct postures.  The correct posture provides the optimal environment for your joints to work. All of your joints have less wear and tear when properly supported by a spine with good posture. This means you will  experience a healthier, less painful body.  Correct posture must be practiced with all of your activities, especially prolonged sitting and standing. Correct posture is as important when doing repetitive low-stress activities (typing) as it is when doing a single heavy-load activity (lifting). RESTING POSITIONS Consider which positions are most painful for you when choosing a resting position. If you have pain with flexion-based activities (sitting, bending, stooping, squatting), choose a position that allows you to rest in a less flexed posture. You would want to avoid curling into a fetal position on your side. If your pain worsens with extension-based activities (prolonged standing, working overhead), avoid resting in an extended position such as sleeping on your stomach. Most people will find more comfort when they rest with their spine in a more neutral position, neither too rounded nor too   arched. Lying on a non-sagging bed on your side with a pillow between your knees, or on your back with a pillow under your knees will often provide some relief. Keep in mind, being in any one position for a prolonged period of time, no matter how correct your posture, can still lead to stiffness. PROPER SITTING POSTURE In order to minimize stress and discomfort on your spine, you must sit with correct posture Sitting with good posture should be effortless for a healthy body. Returning to good posture is a gradual process. Many people can work toward this most comfortably by using various supports until they have the flexibility and strength to maintain this posture on their own. When sitting with proper posture, your ears will fall over your shoulders and your shoulders will fall over your hips. You should use the back of the chair to support your upper back. Your low back will be in a neutral position, just slightly arched. You may place a small pillow or folded towel at the base of your low back for support.  When  working at a desk, create an environment that supports good, upright posture. Without extra support, muscles fatigue and lead to excessive strain on joints and other tissues. Keep these recommendations in mind: CHAIR:   A chair should be able to slide under your desk when your back makes contact with the back of the chair. This allows you to work closely.  The chair's height should allow your eyes to be level with the upper part of your monitor and your hands to be slightly lower than your elbows. BODY POSITION  Your feet should make contact with the floor. If this is not possible, use a foot rest.  Keep your ears over your shoulders. This will reduce stress on your neck and low back. INCORRECT SITTING POSTURES   If you are feeling tired and unable to assume a healthy sitting posture, do not slouch or slump. This puts excessive strain on your back tissues, causing more damage and pain. Healthier options include:  Using more support, like a lumbar pillow.  Switching tasks to something that requires you to be upright or walking.  Talking a brief walk.  Lying down to rest in a neutral-spine position. PROLONGED STANDING WHILE SLIGHTLY LEANING FORWARD  When completing a task that requires you to lean forward while standing in one place for a long time, place either foot up on a stationary 2-4 inch high object to help maintain the best posture. When both feet are on the ground, the low back tends to lose its slight inward curve. If this curve flattens (or becomes too large), then the back and your other joints will experience too much stress, fatigue more quickly and can cause pain.  CORRECT STANDING POSTURES Proper standing posture should be assumed with all daily activities, even if they only take a few moments, like when brushing your teeth. As in sitting, your ears should fall over your shoulders and your shoulders should fall over your hips. You should keep a slight tension in your abdominal  muscles to brace your spine. Your tailbone should point down to the ground, not behind your body, resulting in an over-extended swayback posture.  INCORRECT STANDING POSTURES  Common incorrect standing postures include a forward head, locked knees and/or an excessive swayback. WALKING Walk with an upright posture. Your ears, shoulders and hips should all line-up. PROLONGED ACTIVITY IN A FLEXED POSITION When completing a task that requires you to bend forward   at your waist or lean over a low surface, try to find a way to stabilize 3 of 4 of your limbs. You can place a hand or elbow on your thigh or rest a knee on the surface you are reaching across. This will provide you more stability so that your muscles do not fatigue as quickly. By keeping your knees relaxed, or slightly bent, you will also reduce stress across your low back. CORRECT LIFTING TECHNIQUES DO :   Assume a wide stance. This will provide you more stability and the opportunity to get as close as possible to the object which you are lifting.  Tense your abdominals to brace your spine; then bend at the knees and hips. Keeping your back locked in a neutral-spine position, lift using your leg muscles. Lift with your legs, keeping your back straight.  Test the weight of unknown objects before attempting to lift them.  Try to keep your elbows locked down at your sides in order get the best strength from your shoulders when carrying an object.  Always ask for help when lifting heavy or awkward objects. INCORRECT LIFTING TECHNIQUES DO NOT:   Lock your knees when lifting, even if it is a small object.  Bend and twist. Pivot at your feet or move your feet when needing to change directions.  Assume that you cannot safely pick up a paperclip without proper posture.   This information is not intended to replace advice given to you by your health care provider. Make sure you discuss any questions you have with your health care provider.     Document Released: 10/15/2005 Document Revised: 03/01/2015 Document Reviewed: 01/27/2009 [employer] Interactive Patient Education 2016 [employer]  

## 2016-09-04 DIAGNOSIS — F419 Anxiety disorder, unspecified: Secondary | ICD-10-CM | POA: Diagnosis not present

## 2016-09-14 IMAGING — DX DG CHEST 2V
3 series · 3 of 3 positions shown · non-contrast
Comparison: August 11, 2015.

CLINICAL DATA: Shortness of breath.

EXAM:
CHEST  2 VIEW

[chest pa (1 of 2)]
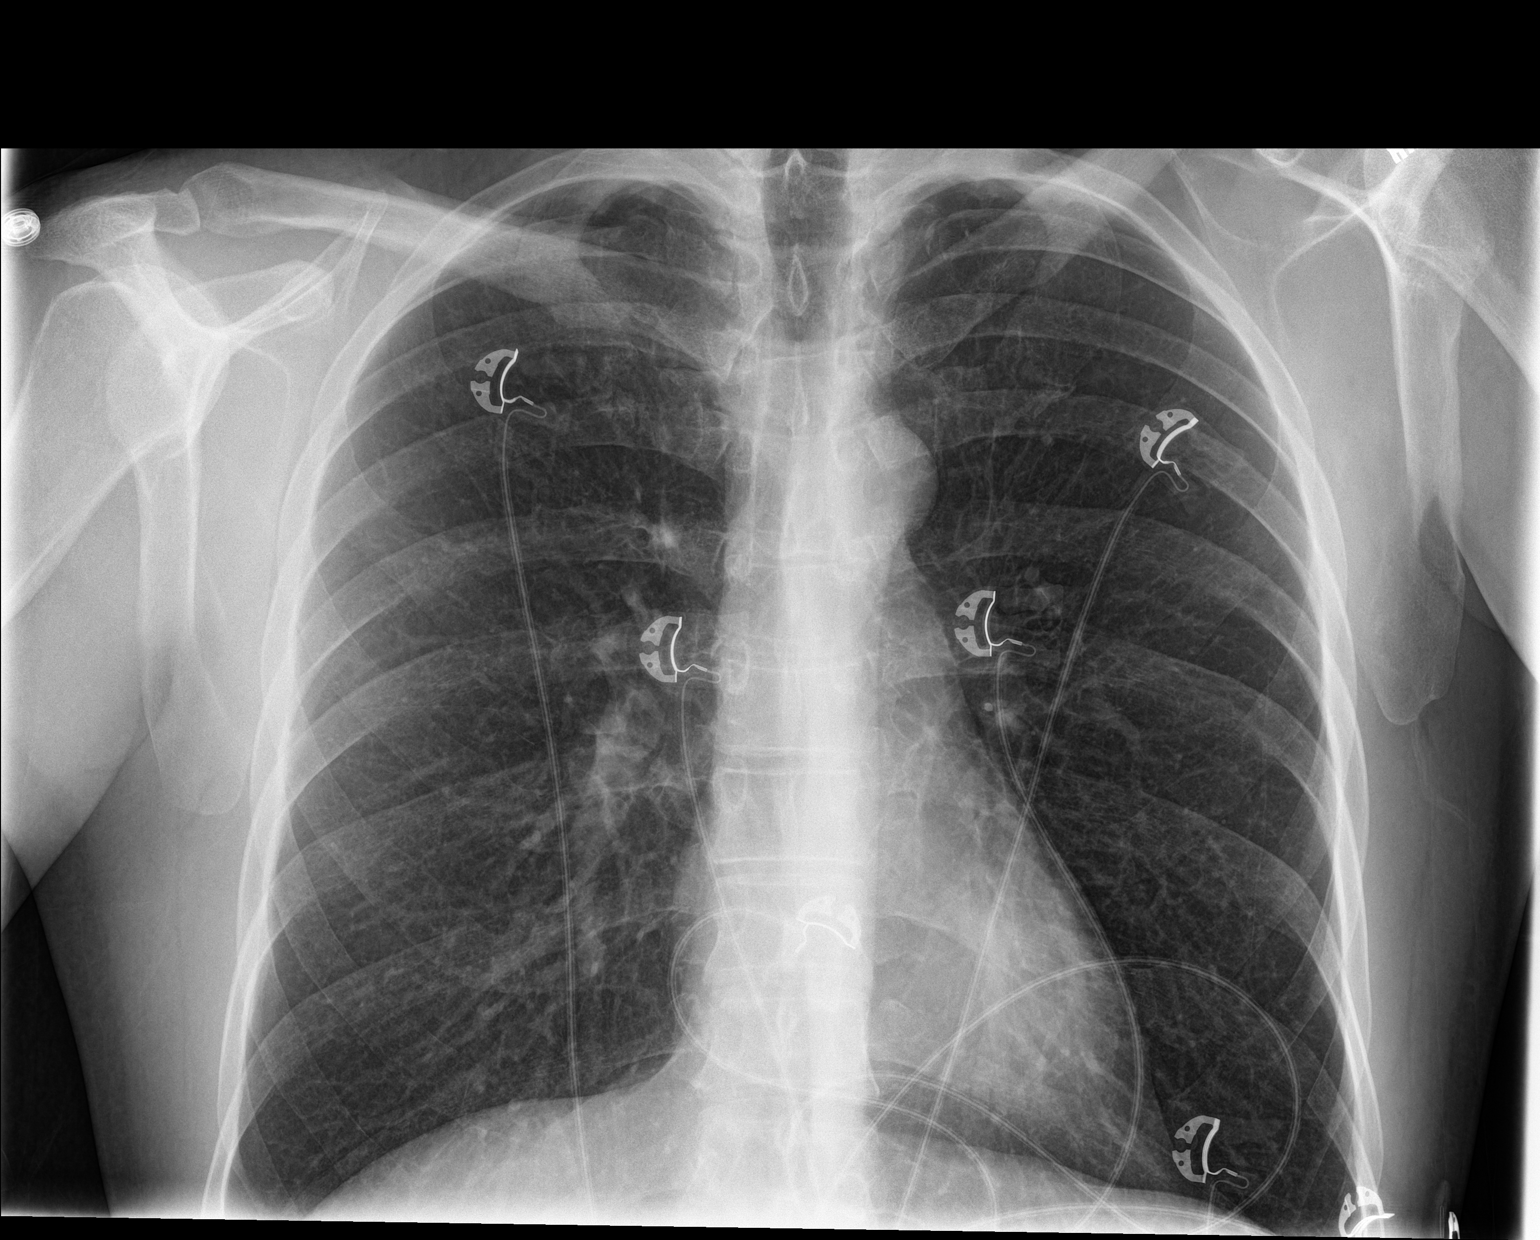

[chest lat]
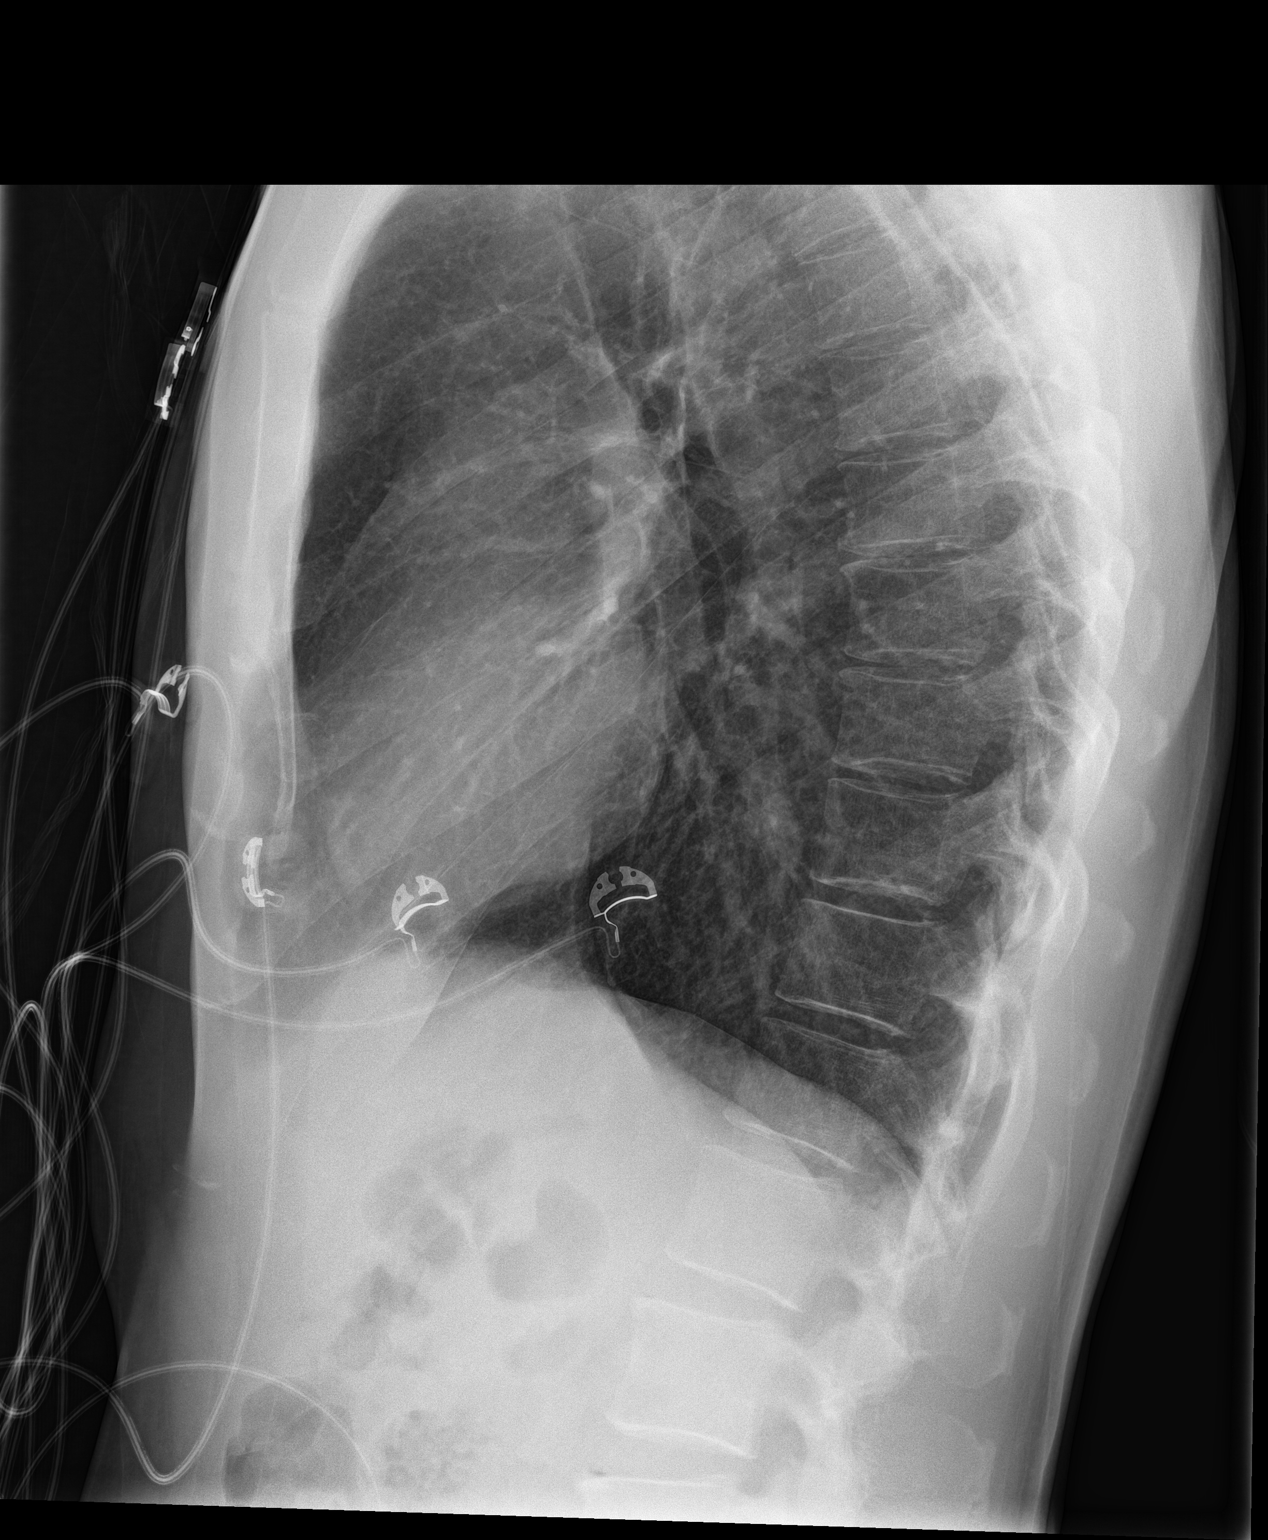

[chest pa (2 of 2)]
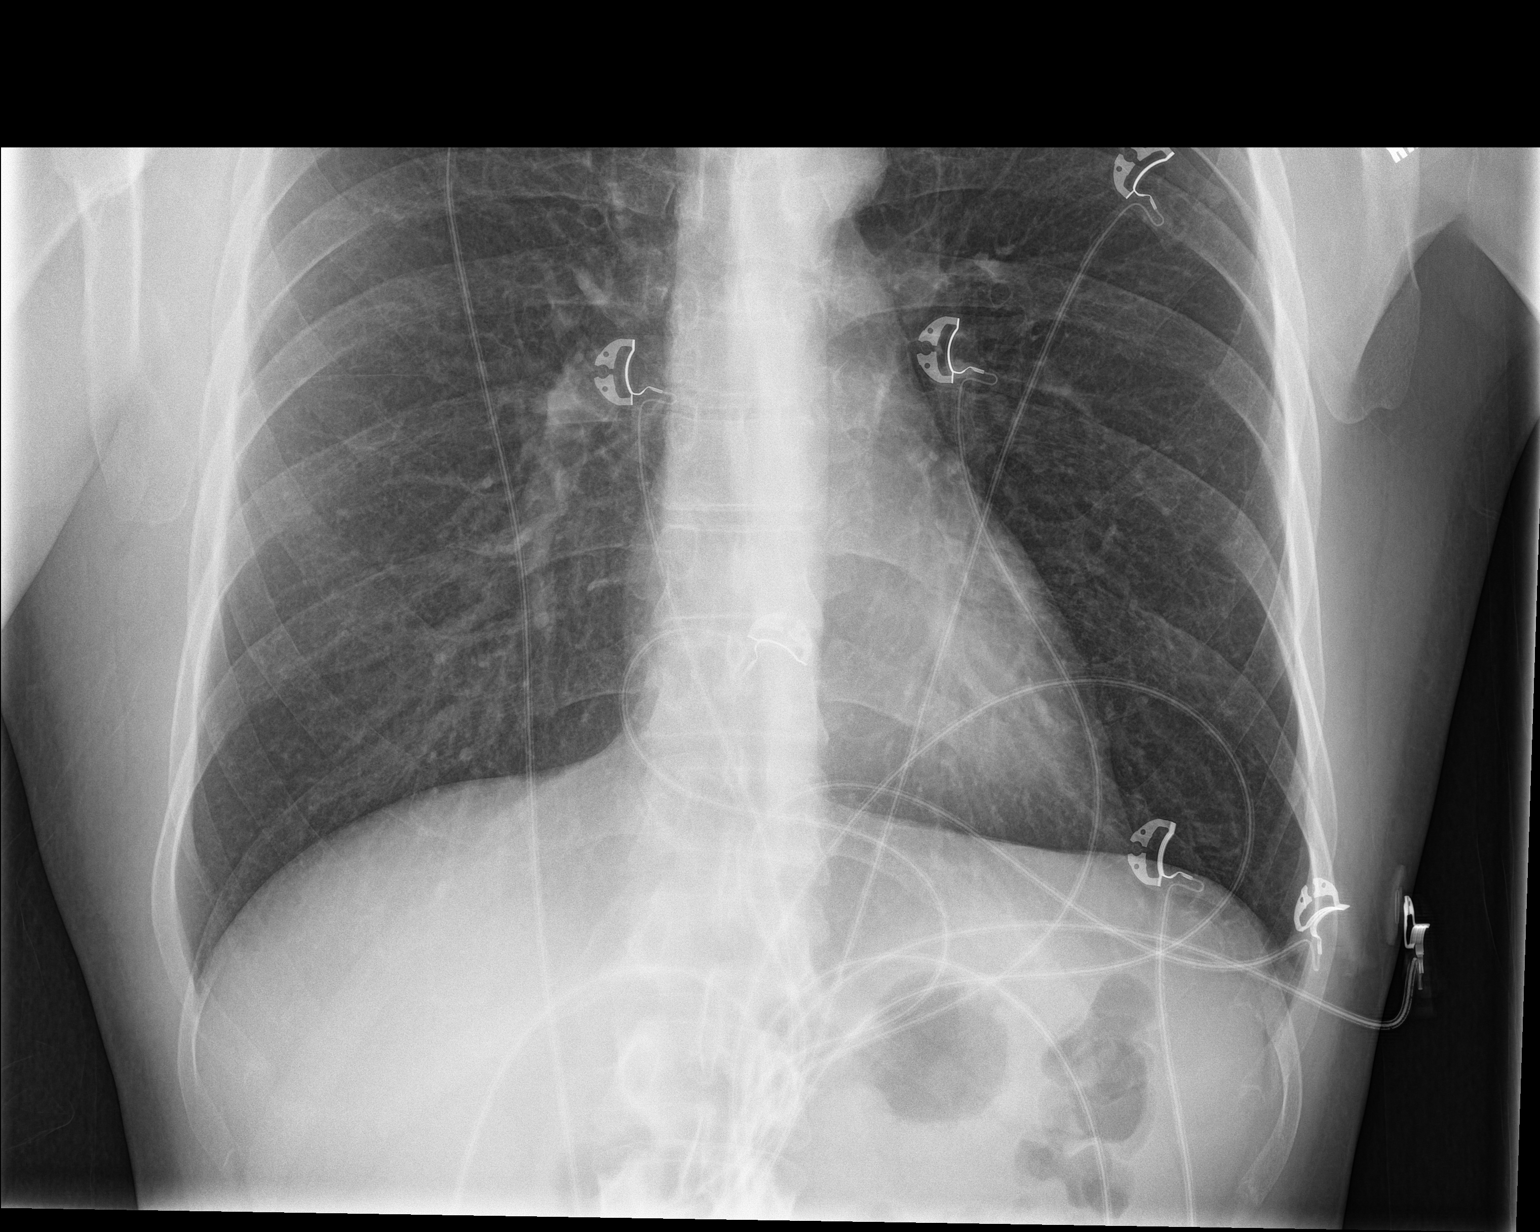

[3 of 3 positions shown; findings below may reference images not displayed]

FINDINGS: The heart size and mediastinal contours are within normal limits.
Both lungs are clear. No pneumothorax or pleural effusion is noted.
The visualized skeletal structures are unremarkable.
IMPRESSION: No active cardiopulmonary disease.

## 2017-01-01 DIAGNOSIS — F419 Anxiety disorder, unspecified: Secondary | ICD-10-CM | POA: Diagnosis not present

## 2017-04-23 DIAGNOSIS — F419 Anxiety disorder, unspecified: Secondary | ICD-10-CM | POA: Diagnosis not present

## 2017-05-29 ENCOUNTER — Ambulatory Visit (INDEPENDENT_AMBULATORY_CARE_PROVIDER_SITE_OTHER): Payer: Medicare Other | Admitting: Family Medicine

## 2017-05-29 ENCOUNTER — Encounter: Payer: Self-pay | Admitting: Family Medicine

## 2017-05-29 ENCOUNTER — Ambulatory Visit (INDEPENDENT_AMBULATORY_CARE_PROVIDER_SITE_OTHER): Payer: Medicare Other

## 2017-05-29 VITALS — BP 119/75 | HR 91 | Temp 98.0°F | Ht 73.0 in | Wt 173.0 lb

## 2017-05-29 DIAGNOSIS — M542 Cervicalgia: Secondary | ICD-10-CM | POA: Diagnosis not present

## 2017-05-29 MED ORDER — HYDROCODONE-ACETAMINOPHEN 5-325 MG PO TABS: 1.0000 | ORAL_TABLET | Freq: Four times a day (QID) | ORAL | Status: AC | PRN

## 2017-05-29 MED ORDER — HYDROCODONE-ACETAMINOPHEN 10-325 MG PO TABS: ORAL_TABLET | ORAL | 0 refills | Status: DC

## 2017-05-29 MED ORDER — CYCLOBENZAPRINE HCL 10 MG PO TABS: ORAL_TABLET | ORAL | 1 refills | Status: DC
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
## 2017-07-31 ENCOUNTER — Ambulatory Visit (INDEPENDENT_AMBULATORY_CARE_PROVIDER_SITE_OTHER): Payer: Medicare Other | Admitting: Family Medicine

## 2017-07-31 ENCOUNTER — Encounter: Payer: Self-pay | Admitting: Family Medicine

## 2017-07-31 VITALS — BP 109/74 | HR 72 | Temp 97.4°F | Ht 73.0 in | Wt 172.2 lb

## 2017-07-31 DIAGNOSIS — K047 Periapical abscess without sinus: Secondary | ICD-10-CM

## 2017-07-31 MED ORDER — AMOXICILLIN 500 MG PO CAPS: 500.0000 mg | ORAL_CAPSULE | Freq: Two times a day (BID) | ORAL | 0 refills | Status: DC

## 2017-07-31 NOTE — Progress Notes (Signed)
   HPI  Patient presents today here with abscess tooth.  Patient states that a tooth on his left lower jaw is tender and painful. He took some old antibiotics which helped, a few days ago the pain began to come back and he developed a knot at the bottom of the tooth. He is able to tolerate food and fluids like usual.  He has significant depression, no SI, he does have good follow-up with psychiatry.   PMH: Smoking status noted ROS: Per HPI  Objective: BP 109/74   Pulse 72   Temp (!) 97.4 F (36.3 C) (Oral)   Ht 6\' 1"  (1.854 m)   Wt 172 lb 3.2 oz (78.1 kg)   BMI 22.72 kg/m  Gen: NAD, alert, cooperative with exam HEENT: NCAT, poor dentition, TTP of 1st molar on L lower side and TTP of gums in that area.  CV: RRR, good S1/S2, no murmur Resp: CTABL, no wheezes, non-labored Ext: No edema, warm Neuro: Alert and oriented, No gross deficits  Assessment and plan:  # Abscessed tooth Treat with amoxicillin, discussed the patient with definitive treatment has to be performed by dentist, recommended calling today    Meds ordered this encounter  Medications  . amoxicillin (AMOXIL) 500 MG capsule    Sig: Take 1 capsule (500 mg total) by mouth 2 (two) times daily.    Dispense:  28 capsule    Refill:  [place], MD [place] 07/31/2017, 5:13 PM

## 2017-08-23 ENCOUNTER — Encounter (HOSPITAL_COMMUNITY): Payer: Self-pay | Admitting: *Deleted

## 2017-08-23 ENCOUNTER — Emergency Department (HOSPITAL_COMMUNITY)
Admission: EM | Admit: 2017-08-23 | Discharge: 2017-08-23 | Disposition: A | Discharge status: Home / Self Care | Payer: Medicare Other | Attending: Emergency Medicine | Admitting: Emergency Medicine

## 2017-08-23 DIAGNOSIS — Z79899 Other long term (current) drug therapy: Secondary | ICD-10-CM | POA: Insufficient documentation

## 2017-08-23 DIAGNOSIS — K029 Dental caries, unspecified: Secondary | ICD-10-CM | POA: Insufficient documentation

## 2017-08-23 DIAGNOSIS — J449 Chronic obstructive pulmonary disease, unspecified: Secondary | ICD-10-CM | POA: Insufficient documentation

## 2017-08-23 DIAGNOSIS — F17210 Nicotine dependence, cigarettes, uncomplicated: Secondary | ICD-10-CM | POA: Diagnosis not present

## 2017-08-23 DIAGNOSIS — K0889 Other specified disorders of teeth and supporting structures: Secondary | ICD-10-CM

## 2017-08-23 MED ORDER — TRAMADOL HCL 50 MG PO TABS: 50.0000 mg | ORAL_TABLET | Freq: Four times a day (QID) | ORAL | 0 refills | Status: DC | PRN

## 2017-08-23 MED ORDER — AMOXICILLIN 500 MG PO CAPS: 500.0000 mg | ORAL_CAPSULE | Freq: Three times a day (TID) | ORAL | 0 refills | Status: DC

## 2017-08-23 NOTE — ED Triage Notes (Signed)
Pt reports dental pain was on antibiotics started on 10/3 did not take as prescribed. Pt says has tried to stretch meds out. Pt says swelling again & has appointment next week.

## 2017-08-23 NOTE — Discharge Instructions (Signed)
Amoxicillin as prescribed.  Tramadol as prescribed as needed for pain.  Follow-up with dentistry in the next week.

## 2017-08-23 NOTE — ED Provider Notes (Signed)
[facility] EMERGENCY DEPARTMENT Provider Note   CSN: [id number] Arrival date & time: 08/23/17  2254     History   Chief Complaint Chief Complaint  Patient presents with  . Dental Pain    HPI KAEDAN RICHERT is a 46 y.o. male.  Patient is a 46 year old male with history of anxiety, GERD, and COPD.  He presents for evaluation of dental pain.  He has been having ongoing issues with poor dentition/gingival and dental decay for several weeks.  He was prescribed amoxicillin which seemed to help, however he has run out of this.  He has no regular dentist and has had issues finding a dentist since his last visit.   The history is provided by the patient.  Dental Pain   This is a new problem. Episode onset: 1 month ago. The problem occurs constantly. The problem has been gradually worsening. The pain is moderate. Treatments tried: Amoxicillin. The treatment provided mild relief.    Past Medical History:  Diagnosis Date  . Anxiety disorder   . Chronic abdominal pain   . COPD (chronic obstructive pulmonary disease) ([place])   . Former heavy cigarette smoker (20-39 per day)   . GERD (gastroesophageal reflux disease)   . H. pylori infection     Patient Active Problem List   Diagnosis Date Noted  . Erectile dysfunction 08/15/2015  . Wrist laceration 08/09/2015  . GERD (gastroesophageal reflux disease) 06/28/2015  . COPD (chronic obstructive pulmonary disease) ([place]) 06/28/2015  . GAD (generalized anxiety disorder) 06/28/2015  . Arrhythmia 06/27/2015  . BACK PAIN, LUMBAR, CHRONIC 06/29/2010  . WEIGHT LOSS 06/29/2010  . CHANGE IN BOWELS 06/29/2010  . EPIGASTRIC PAIN 06/29/2010    Past Surgical History:  Procedure Laterality Date  . HERNIA REPAIR     x 2, inguinal, bilateral       Home Medications    Prior to Admission medications   Medication Sig Start Date End Date Taking? Authorizing Provider  albuterol (PROVENTIL HFA;VENTOLIN HFA) 108 (90 BASE) MCG/ACT inhaler Inhale 2  puffs into the lungs every 6 (six) hours as needed for wheezing or shortness of breath. 07/19/14  Yes Oxford, Orson Ape, FNP  amoxicillin (AMOXIL) 500 MG capsule Take 1 capsule (500 mg total) by mouth 2 (two) times daily. 07/31/17  Yes Timmothy Euler, MD  hydrOXYzine (ATARAX/VISTARIL) 25 MG tablet  08/21/16  Yes [provider]  sildenafil (REVATIO) 20 MG tablet Take 2-4 pills once daily as needed for erectile dsyfunction 08/15/15  Yes Timmothy Euler, MD  SYMBICORT 160-4.5 MCG/ACT inhaler INHALE 2 PUFFS INTO THE LUNGS TWICE DAILY 08/26/15  Yes Hawks, Christy A, FNP  traMADol (ULTRAM) 50 MG tablet Take 2 tablets (100 mg total) by mouth every 12 (twelve) hours as needed. 08/28/16  Yes Hawks, Theador Hawthorne, FNP    Family History Family History  Problem Relation Age of Onset  . Liver disease Mother   . Alcoholism Mother     Social History Social History  Substance Use Topics  . Smoking status: Current Every Day Smoker    Packs/day: 0.50    Types: Cigarettes    Last attempt to quit: 09/18/2004  . Smokeless tobacco: Current User    Types: Chew     Comment: 2 or more cans smokeless per week, 09/07/15 smoking 4-5 cigs daily  . Alcohol use No     Allergies   Other and Tetracyclines & related   Review of Systems Review of Systems  All other systems reviewed and  are negative.    Physical Exam Updated Vital Signs BP (!) 121/91 (BP Location: Right Arm)   Temp 98 F (36.7 C) (Oral)   Resp 18   Ht 6\' 1"  (1.854 m)   Wt 78.5 kg (173 lb)   SpO2 99%   BMI 22.82 kg/m   Physical Exam  Constitutional: He is oriented to person, place, and time. He appears well-developed and well-nourished. No distress.  HENT:  Head: Normocephalic and atraumatic.  Patient has poor dentition and multiple missing teeth throughout.  His left lower canine, bicuspid, and lateral incisor are heavily decayed with significant gum loss and gingival inflammation.  There is no obvious abscess.  Neck:  Normal range of motion. Neck supple.  Pulmonary/Chest: Effort normal.  Lymphadenopathy:    He has no cervical adenopathy.  Neurological: He is alert and oriented to person, place, and time.  Skin: Skin is warm and dry. He is not diaphoretic.  Nursing note and vitals reviewed.    ED Treatments / Results  Labs (all labs ordered are listed, but only abnormal results are displayed) Labs Reviewed - No data to display  EKG  EKG Interpretation None       Radiology No results found.  Procedures Procedures (including critical care time)  Medications Ordered in ED Medications - No data to display   Initial Impression / Assessment and Plan / ED Course  I have reviewed the triage vital signs and the nursing notes.  Pertinent labs & imaging results that were available during my care of the patient were reviewed by me and considered in my medical decision making (see chart for details).  Patient will be treated with amoxicillin, tramadol, and follow-up with dentistry.  The [place] has been reviewed and the patient has not filled a prescription for narcotics in several months, and no obvious pattern of repeat prescriptions is noted.  Final Clinical Impressions(s) / ED Diagnoses   Final diagnoses:  None    New Prescriptions New Prescriptions   No medications on file     Veryl Speak, MD 08/23/17 2318

## 2017-09-13 ENCOUNTER — Telehealth: Payer: Self-pay | Admitting: *Deleted

## 2017-09-13 NOTE — Telephone Encounter (Signed)
Fax Rf request Flonase Not on current med list

## 2017-09-16 MED ORDER — FLUTICASONE PROPIONATE 50 MCG/ACT NA SUSP
2.0000 | Freq: Every day | NASAL | 6 refills | Status: DC
Start: 2017-09-16 — End: 2018-09-19

## 2017-09-16 NOTE — Telephone Encounter (Signed)
Flonase Prescription sent to pharmacy   

## 2017-09-16 NOTE — Addendum Note (Signed)
Addended by: Evelina Dun A on: 09/16/2017 10:47 AM   Modules accepted: Orders

## 2018-06-08 ENCOUNTER — Other Ambulatory Visit: Payer: Self-pay | Admitting: Family Medicine

## 2018-06-09 NOTE — Telephone Encounter (Signed)
Last seen 07/31/17  Dr Wendi Snipes

## 2018-06-17 ENCOUNTER — Other Ambulatory Visit: Payer: Self-pay | Admitting: Family

## 2018-08-19 ENCOUNTER — Ambulatory Visit (INDEPENDENT_AMBULATORY_CARE_PROVIDER_SITE_OTHER): Payer: Medicare Other | Admitting: Family

## 2018-08-19 ENCOUNTER — Encounter: Payer: Self-pay | Admitting: Family

## 2018-08-19 VITALS — BP 124/83 | HR 80 | Temp 97.1°F | Ht 73.0 in | Wt 179.2 lb

## 2018-08-19 DIAGNOSIS — F411 Generalized anxiety disorder: Secondary | ICD-10-CM

## 2018-08-19 DIAGNOSIS — K219 Gastro-esophageal reflux disease without esophagitis: Secondary | ICD-10-CM

## 2018-08-19 DIAGNOSIS — J441 Chronic obstructive pulmonary disease with (acute) exacerbation: Secondary | ICD-10-CM

## 2018-08-19 DIAGNOSIS — M5441 Lumbago with sciatica, right side: Secondary | ICD-10-CM | POA: Diagnosis not present

## 2018-08-19 DIAGNOSIS — G8929 Other chronic pain: Secondary | ICD-10-CM

## 2018-08-19 DIAGNOSIS — F1120 Opioid dependence, uncomplicated: Secondary | ICD-10-CM | POA: Insufficient documentation

## 2018-08-19 DIAGNOSIS — J449 Chronic obstructive pulmonary disease, unspecified: Secondary | ICD-10-CM | POA: Diagnosis not present

## 2018-08-19 DIAGNOSIS — Z79899 Other long term (current) drug therapy: Secondary | ICD-10-CM | POA: Insufficient documentation

## 2018-08-19 MED ORDER — TRAMADOL HCL 50 MG PO TABS: 50.0000 mg | ORAL_TABLET | Freq: Two times a day (BID) | ORAL | 0 refills | Status: DC | PRN

## 2018-08-19 MED ORDER — CYCLOBENZAPRINE HCL 10 MG PO TABS: ORAL_TABLET | ORAL | 1 refills | Status: DC

## 2018-08-19 MED ORDER — DULOXETINE HCL 30 MG PO CPEP: 30.0000 mg | ORAL_CAPSULE | Freq: Every day | ORAL | 1 refills | Status: DC

## 2018-08-19 MED ORDER — ALBUTEROL SULFATE HFA 108 (90 BASE) MCG/ACT IN AERS: 2.0000 | INHALATION_SPRAY | Freq: Four times a day (QID) | RESPIRATORY_TRACT | 11 refills | Status: DC | PRN

## 2018-08-19 MED ORDER — BUDESONIDE-FORMOTEROL FUMARATE 160-4.5 MCG/ACT IN AERO: 2.0000 | INHALATION_SPRAY | Freq: Two times a day (BID) | RESPIRATORY_TRACT | 3 refills | Status: DC

## 2018-08-19 NOTE — Patient Instructions (Signed)

## 2018-08-19 NOTE — Progress Notes (Signed)
Subjective:    Patient ID: Warren Miller, male    DOB: 05-26-1971, 47 y.o.   MRN: [id number]  Chief Complaint  Patient presents with  . Back Pain  . Needs pain contract  . medication refills   Pt presents to the office today for chronic follow up. He states he was seeing [facility] for GAD, but have not seen them 4-6 months.  Back Pain  This is a chronic problem. The current episode started more than 1 year ago. The problem occurs constantly. The problem has been waxing and waning since onset. The pain is present in the lumbar spine. The quality of the pain is described as aching. The pain is at a severity of 7/10. The pain is moderate. Associated symptoms include leg pain and numbness. Pertinent negatives include no bladder incontinence or bowel incontinence. He has tried muscle relaxant, analgesics and NSAIDs for the symptoms. The treatment provided mild relief.  Gastroesophageal Reflux  He complains of belching and heartburn. He reports no coughing. This is a chronic problem. The current episode started more than 1 year ago. The problem has been waxing and waning. He has tried an antacid (milk) for the symptoms. The treatment provided mild relief.  Anxiety  Presents for follow-up visit. Symptoms include decreased concentration, excessive worry, irritability and restlessness. Symptoms occur most days. The severity of symptoms is moderate. The quality of sleep is good.    COPD PT states he smokes a pack a day. States he uses Symbicort "every now and then".     Review of Systems  Constitutional: Positive for irritability.  Respiratory: Negative for cough.   Gastrointestinal: Positive for heartburn. Negative for bowel incontinence.  Genitourinary: Negative for bladder incontinence.  Musculoskeletal: Positive for back pain.  Neurological: Positive for numbness.  Psychiatric/Behavioral: Positive for decreased concentration.  All other systems reviewed and are negative.      Objective:    Physical Exam  Constitutional: He is oriented to person, place, and time. He appears well-developed and well-nourished. No distress.  HENT:  Head: Normocephalic.  Right Ear: External ear normal.  Left Ear: External ear normal.  Mouth/Throat: Oropharynx is clear and moist.  Eyes: Pupils are equal, round, and reactive to light. Right eye exhibits no discharge. Left eye exhibits no discharge.  Neck: Normal range of motion. Neck supple. No thyromegaly present.  Cardiovascular: Normal rate, regular rhythm, normal heart sounds and intact distal pulses.  No murmur heard. Pulmonary/Chest: Effort normal and breath sounds normal. No respiratory distress. He has no wheezes.  Abdominal: Soft. Bowel sounds are normal. He exhibits no distension. There is no tenderness.  Musculoskeletal: Normal range of motion. He exhibits no edema or tenderness.  Neurological: He is alert and oriented to person, place, and time. He has normal reflexes. No cranial nerve deficit.  Skin: Skin is warm and dry. No rash noted. No erythema.  Psychiatric: Judgment and thought content normal. His mood appears anxious. He is withdrawn.  Vitals reviewed.     BP 124/83   Pulse 80   Temp (!) 97.1 F (36.2 C) (Oral)   Ht '6\' 1"'  (1.854 m)   Wt 179 lb 3.2 oz (81.3 kg)   BMI 23.64 kg/m      Assessment & Plan:  ROMEY MATHIESON comes in today with chief complaint of Back Pain; Needs pain contract; and medication refills   Diagnosis and orders addressed:  1. Chronic obstructive pulmonary disease, unspecified COPD type ([place]) - ToxASSURE Select 13 (MW), Urine -  CMP14+EGFR - CBC with Differential/Platelet - budesonide-formoterol (SYMBICORT) 160-4.5 MCG/ACT inhaler; Inhale 2 puffs into the lungs 2 (two) times daily.  Dispense: 10.2 g; Refill: 3  2. Chronic bilateral low back pain with right-sided sciatica - CMP14+EGFR - CBC with Differential/Platelet - DULoxetine (CYMBALTA) 30 MG capsule; Take 1 capsule (30 mg total) by  mouth daily.  Dispense: 90 capsule; Refill: 1 - cyclobenzaprine (FLEXERIL) 10 MG tablet; TAKE 1 TABLET BY MOUTH THREE TIMES DAILY AS NEEDED FOR MUSCLE SPASM  Dispense: 60 tablet; Refill: 1 - traMADol (ULTRAM) 50 MG tablet; Take 1-2 tablets (50-100 mg total) by mouth every 12 (twelve) hours as needed.  Dispense: 60 tablet; Refill: 0  3. GAD (generalized anxiety disorder) Will start Cymbalta today  Stress management discussed RTO in 1 month - CMP14+EGFR - CBC with Differential/Platelet - DULoxetine (CYMBALTA) 30 MG capsule; Take 1 capsule (30 mg total) by mouth daily.  Dispense: 90 capsule; Refill: 1  4. Gastroesophageal reflux disease, esophagitis presence not specified - CMP14+EGFR - CBC with Differential/Platelet  5. Uncomplicated opioid dependence ([place]) - ToxASSURE Select 13 (MW), Urine - CMP14+EGFR - CBC with Differential/Platelet  6. Controlled substance agreement signed - ToxASSURE Select 13 (MW), Urine - CMP14+EGFR - CBC with Differential/Platelet  7. COPD exacerbation ([facility]) - albuterol (PROVENTIL HFA;VENTOLIN HFA) 108 (90 Base) MCG/ACT inhaler; Inhale 2 puffs into the lungs every 6 (six) hours as needed for wheezing or shortness of breath.  Dispense: 1 Inhaler; Refill: 11   Labs pending Health Maintenance reviewed Diet and exercise encouraged  Follow up plan: 1 month to recheck GAD and back pain  Evelina Dun, FNP

## 2018-08-20 LAB — CBC WITH DIFFERENTIAL/PLATELET
BASOS: 1 %
Basophils Absolute: 0 10*3/uL (ref 0.0–0.2)
EOS (ABSOLUTE): 0.2 10*3/uL (ref 0.0–0.4)
EOS: 4 %
HEMATOCRIT: 45.9 % (ref 37.5–51.0)
HEMOGLOBIN: 15.6 g/dL (ref 13.0–17.7)
Immature Grans (Abs): 0 10*3/uL (ref 0.0–0.1)
Immature Granulocytes: 0 %
LYMPHS ABS: 1.5 10*3/uL (ref 0.7–3.1)
Lymphs: 30 %
MCH: 29.7 pg (ref 26.6–33.0)
MCHC: 34 g/dL (ref 31.5–35.7)
MCV: 87 fL (ref 79–97)
MONOCYTES: 14 %
MONOS ABS: 0.7 10*3/uL (ref 0.1–0.9)
NEUTROS ABS: 2.6 10*3/uL (ref 1.4–7.0)
Neutrophils: 51 %
Platelets: 203 10*3/uL (ref 150–450)
RBC: 5.26 x10E6/uL (ref 4.14–5.80)
RDW: 12.8 % (ref 12.3–15.4)
WBC: 5.1 10*3/uL (ref 3.4–10.8)

## 2018-08-20 LAB — CMP14+EGFR
ALBUMIN: 4.1 g/dL (ref 3.5–5.5)
ALK PHOS: 112 IU/L (ref 39–117)
ALT: 47 IU/L — ABNORMAL HIGH (ref 0–44)
AST: 32 IU/L (ref 0–40)
Albumin/Globulin Ratio: 1.7 (ref 1.2–2.2)
BUN / CREAT RATIO: 10 (ref 9–20)
BUN: 9 mg/dL (ref 6–24)
Bilirubin Total: 0.4 mg/dL (ref 0.0–1.2)
CO2: 24 mmol/L (ref 20–29)
CREATININE: 0.86 mg/dL (ref 0.76–1.27)
Calcium: 9 mg/dL (ref 8.7–10.2)
Chloride: 103 mmol/L (ref 96–106)
GFR, EST AFRICAN AMERICAN: 118 mL/min/{1.73_m2} (ref >59)
GFR, EST NON AFRICAN AMERICAN: 102 mL/min/{1.73_m2} (ref >59)
GLOBULIN, TOTAL: 2.4 g/dL (ref 1.5–4.5)
GLUCOSE: 80 mg/dL (ref 65–99)
Potassium: 4.2 mmol/L (ref 3.5–5.2)
SODIUM: 143 mmol/L (ref 134–144)
TOTAL PROTEIN: 6.5 g/dL (ref 6.0–8.5)

## 2018-08-24 LAB — TOXASSURE SELECT 13 (MW), URINE

## 2018-09-19 ENCOUNTER — Ambulatory Visit (INDEPENDENT_AMBULATORY_CARE_PROVIDER_SITE_OTHER): Payer: Medicare Other | Admitting: Family

## 2018-09-19 ENCOUNTER — Encounter: Payer: Self-pay | Admitting: Family

## 2018-09-19 VITALS — BP 124/84 | HR 83 | Temp 97.2°F | Ht 73.0 in | Wt 178.0 lb

## 2018-09-19 DIAGNOSIS — G8929 Other chronic pain: Secondary | ICD-10-CM | POA: Diagnosis not present

## 2018-09-19 DIAGNOSIS — M5441 Lumbago with sciatica, right side: Secondary | ICD-10-CM

## 2018-09-19 DIAGNOSIS — F411 Generalized anxiety disorder: Secondary | ICD-10-CM | POA: Diagnosis not present

## 2018-09-19 MED ORDER — DULOXETINE HCL 60 MG PO CPEP
60.0000 mg | ORAL_CAPSULE | Freq: Every day | ORAL | 3 refills | Status: DC
Start: 2018-09-19 — End: 2019-11-30

## 2018-09-19 MED ORDER — FLUTICASONE PROPIONATE 50 MCG/ACT NA SUSP: 2.0000 | Freq: Every day | NASAL | 6 refills | Status: DC

## 2018-09-19 MED ORDER — TRAMADOL HCL 50 MG PO TABS: 50.0000 mg | ORAL_TABLET | Freq: Two times a day (BID) | ORAL | 2 refills | Status: DC | PRN

## 2018-09-19 NOTE — Progress Notes (Signed)
   Subjective:    Patient ID: Warren Miller, male    DOB: Jun 28, 1971, 47 y.o.   MRN: [id number]  Chief Complaint  Patient presents with  . Anxiety    one month recheck    Anxiety  Presents for follow-up visit. Symptoms include decreased concentration, excessive worry, irritability, nervous/anxious behavior and restlessness. Symptoms occur constantly. The severity of symptoms is moderate.    Back Pain  This is a chronic problem. The current episode started more than 1 year ago. The problem occurs intermittently. The problem has been waxing and waning since onset. The pain is present in the lumbar spine. The pain is at a severity of 7/10. The pain is moderate.      Review of Systems  Constitutional: Positive for irritability.  Musculoskeletal: Positive for back pain.  Psychiatric/Behavioral: Positive for decreased concentration. The patient is nervous/anxious.   All other systems reviewed and are negative.      Objective:   Physical Exam  Constitutional: He is oriented to person, place, and time. He appears well-developed and well-nourished. No distress.  HENT:  Head: Normocephalic.  Right Ear: External ear normal.  Left Ear: External ear normal.  Mouth/Throat: Oropharynx is clear and moist.  Eyes: Pupils are equal, round, and reactive to light. Right eye exhibits no discharge. Left eye exhibits no discharge.  Neck: Normal range of motion. Neck supple. No thyromegaly present.  Cardiovascular: Normal rate, regular rhythm, normal heart sounds and intact distal pulses.  No murmur heard. Pulmonary/Chest: Effort normal and breath sounds normal. No respiratory distress. He has no wheezes.  Abdominal: Soft. Bowel sounds are normal. He exhibits no distension. There is no tenderness.  Musculoskeletal: He exhibits no edema or tenderness.  Pain in lower back with flexion and extension  Neurological: He is alert and oriented to person, place, and time. He has normal reflexes. No cranial  nerve deficit.  Skin: Skin is warm and dry. No rash noted. No erythema.  Psychiatric: Judgment and thought content normal. His mood appears anxious. He is withdrawn.  Vitals reviewed.     BP 124/84   Pulse 83   Temp (!) 97.2 F (36.2 C) (Oral)   Ht 6\' 1"  (1.854 m)   Wt 178 lb (80.7 kg)   BMI 23.48 kg/m      Assessment & Plan:  Warren Miller comes in today with chief complaint of Anxiety (one month recheck)   Diagnosis and orders addressed:  1. GAD (generalized anxiety disorder) We will increase cymbalta to 60 mg from 30 mg Stress management discussed Referral pending for [facility] - DULoxetine (CYMBALTA) 60 MG capsule; Take 1 capsule (60 mg total) by mouth daily.  Dispense: 90 capsule; Refill: 3 - Ambulatory referral to Psychiatry  2. Chronic bilateral low back pain with right-sided sciatica ROM exercises encouraged  Referral for PT Heat as needed - traMADol (ULTRAM) 50 MG tablet; Take 1-2 tablets (50-100 mg total) by mouth every 12 (twelve) hours as needed.  Dispense: 120 tablet; Refill: 2    Follow up plan: 3 months   Evelina Dun, FNP

## 2018-09-19 NOTE — Patient Instructions (Signed)

## 2018-09-24 ENCOUNTER — Telehealth: Payer: Self-pay | Admitting: Family

## 2018-09-24 DIAGNOSIS — M5441 Lumbago with sciatica, right side: Principal | ICD-10-CM

## 2018-09-24 DIAGNOSIS — G8929 Other chronic pain: Secondary | ICD-10-CM

## 2018-09-24 NOTE — Telephone Encounter (Signed)
Referral to pain clinic placed and pt aware.

## 2018-11-15 DIAGNOSIS — J449 Chronic obstructive pulmonary disease, unspecified: Secondary | ICD-10-CM | POA: Diagnosis not present

## 2018-11-15 DIAGNOSIS — Z79899 Other long term (current) drug therapy: Secondary | ICD-10-CM | POA: Diagnosis not present

## 2018-11-15 DIAGNOSIS — J101 Influenza due to other identified influenza virus with other respiratory manifestations: Secondary | ICD-10-CM | POA: Diagnosis not present

## 2018-11-15 DIAGNOSIS — F410 Panic disorder [episodic paroxysmal anxiety] without agoraphobia: Secondary | ICD-10-CM | POA: Diagnosis not present

## 2018-11-15 DIAGNOSIS — F419 Anxiety disorder, unspecified: Secondary | ICD-10-CM | POA: Diagnosis not present

## 2018-11-15 DIAGNOSIS — J40 Bronchitis, not specified as acute or chronic: Secondary | ICD-10-CM | POA: Diagnosis not present

## 2018-11-15 DIAGNOSIS — F17200 Nicotine dependence, unspecified, uncomplicated: Secondary | ICD-10-CM | POA: Diagnosis not present

## 2018-11-15 DIAGNOSIS — F329 Major depressive disorder, single episode, unspecified: Secondary | ICD-10-CM | POA: Diagnosis not present

## 2018-12-23 ENCOUNTER — Ambulatory Visit: Payer: Medicare Other | Admitting: *Deleted

## 2018-12-23 ENCOUNTER — Ambulatory Visit: Payer: Medicare Other | Admitting: Family

## 2018-12-26 ENCOUNTER — Ambulatory Visit (INDEPENDENT_AMBULATORY_CARE_PROVIDER_SITE_OTHER): Payer: Medicare Other | Admitting: Family

## 2018-12-26 ENCOUNTER — Encounter: Payer: Self-pay | Admitting: Family

## 2018-12-26 VITALS — BP 124/85 | HR 69 | Temp 96.7°F | Ht 73.0 in | Wt 173.4 lb

## 2018-12-26 DIAGNOSIS — J449 Chronic obstructive pulmonary disease, unspecified: Secondary | ICD-10-CM | POA: Diagnosis not present

## 2018-12-26 DIAGNOSIS — F411 Generalized anxiety disorder: Secondary | ICD-10-CM

## 2018-12-26 DIAGNOSIS — M5441 Lumbago with sciatica, right side: Secondary | ICD-10-CM | POA: Diagnosis not present

## 2018-12-26 DIAGNOSIS — Z114 Encounter for screening for human immunodeficiency virus [HIV]: Secondary | ICD-10-CM

## 2018-12-26 DIAGNOSIS — F1120 Opioid dependence, uncomplicated: Secondary | ICD-10-CM

## 2018-12-26 DIAGNOSIS — G8929 Other chronic pain: Secondary | ICD-10-CM

## 2018-12-26 DIAGNOSIS — Z79899 Other long term (current) drug therapy: Secondary | ICD-10-CM

## 2018-12-26 DIAGNOSIS — Z1211 Encounter for screening for malignant neoplasm of colon: Secondary | ICD-10-CM

## 2018-12-26 DIAGNOSIS — K219 Gastro-esophageal reflux disease without esophagitis: Secondary | ICD-10-CM | POA: Diagnosis not present

## 2018-12-26 MED ORDER — TRAMADOL HCL 50 MG PO TABS: 50.0000 mg | ORAL_TABLET | Freq: Two times a day (BID) | ORAL | 2 refills | Status: DC | PRN

## 2018-12-26 MED ORDER — FLUTICASONE PROPIONATE 50 MCG/ACT NA SUSP: 2.0000 | Freq: Every day | NASAL | 6 refills | Status: DC

## 2018-12-26 NOTE — Progress Notes (Signed)
Subjective:    Patient ID: Warren Miller, male    DOB: April 22, 1971, 48 y.o.   MRN: [id number]  Chief Complaint  Patient presents with  . Medical Management of Chronic Issues   Pt presents to the office today for chronic follow up.  Gastroesophageal Reflux  He complains of belching and heartburn. He reports no coughing. This is a chronic problem. The current episode started more than 1 year ago. The problem occurs occasionally. The problem has been waxing and waning. Risk factors include smoking/tobacco exposure. He has tried an antacid for the symptoms. The treatment provided mild relief.  Anxiety  Presents for follow-up visit. Symptoms include depressed mood, excessive worry, insomnia, irritability and nervous/anxious behavior. Symptoms occur most days. The severity of symptoms is moderate. The quality of sleep is good.    Back Pain  This is a chronic problem. The current episode started more than 1 year ago. The problem occurs intermittently. The pain is at a severity of 5/10. The pain is moderate. Associated symptoms include leg pain. Pertinent negatives include no bladder incontinence or bowel incontinence. He has tried analgesics for the symptoms. The treatment provided mild relief.  COPD States he is currently smoking, but "a pack will last me a week". He reports he smokes about 3 cigarettes  a day. Uses albuterol almost everyday, but does not use Symbicort.     Review of Systems  Constitutional: Positive for irritability.  Respiratory: Negative for cough.   Gastrointestinal: Positive for heartburn. Negative for bowel incontinence.  Genitourinary: Negative for bladder incontinence.  Musculoskeletal: Positive for back pain.  Psychiatric/Behavioral: The patient is nervous/anxious and has insomnia.   All other systems reviewed and are negative.      Objective:   Physical Exam Vitals signs reviewed.  Constitutional:      General: He is not in acute distress.    Appearance: He  is well-developed.  HENT:     Head: Normocephalic.     Right Ear: Tympanic membrane normal.     Left Ear: Tympanic membrane normal.  Eyes:     General:        Right eye: No discharge.        Left eye: No discharge.     Pupils: Pupils are equal, round, and reactive to light.  Neck:     Musculoskeletal: Normal range of motion and neck supple.     Thyroid: No thyromegaly.  Cardiovascular:     Rate and Rhythm: Normal rate and regular rhythm.     Heart sounds: Normal heart sounds. No murmur.  Pulmonary:     Effort: Pulmonary effort is normal. No respiratory distress.     Breath sounds: Decreased breath sounds present. No wheezing.  Abdominal:     General: Bowel sounds are normal. There is no distension.     Palpations: Abdomen is soft.     Tenderness: There is no abdominal tenderness.  Musculoskeletal: Normal range of motion.        General: No tenderness.  Skin:    General: Skin is warm and dry.     Findings: No erythema or rash.  Neurological:     Mental Status: He is alert and oriented to person, place, and time.     Cranial Nerves: No cranial nerve deficit.     Deep Tendon Reflexes: Reflexes are normal and symmetric.  Psychiatric:        Mood and Affect: Mood is anxious.        Behavior: Behavior  normal.        Thought Content: Thought content normal.        Judgment: Judgment normal.       BP 124/85   Pulse 69   Temp (!) 96.7 F (35.9 C) (Oral)   Ht '6\' 1"'  (1.854 m)   Wt 173 lb 6.4 oz (78.7 kg)   BMI 22.88 kg/m      Assessment & Plan:  Warren Miller comes in today with chief complaint of Medical Management of Chronic Issues   Diagnosis and orders addressed:  1. Chronic obstructive pulmonary disease, unspecified COPD type ([place]) - CMP14+EGFR - CBC with Differential/Platelet  2. Gastroesophageal reflux disease, esophagitis presence not specified - CMP14+EGFR - CBC with Differential/Platelet  3. Chronic bilateral low back pain with right-sided sciatica -  CMP14+EGFR - CBC with Differential/Platelet - ToxASSURE Select 13 (MW), Urine - traMADol (ULTRAM) 50 MG tablet; Take 1-2 tablets (50-100 mg total) by mouth every 12 (twelve) hours as needed.  Dispense: 60 tablet; Refill: 2  4. Controlled substance agreement signed - CMP14+EGFR - CBC with Differential/Platelet - ToxASSURE Select 13 (MW), Urine  5. Uncomplicated opioid dependence ([place]) - CMP14+EGFR - CBC with Differential/Platelet - ToxASSURE Select 13 (MW), Urine  6. GAD (generalized anxiety disorder) - CMP14+EGFR - CBC with Differential/Platelet  7. Encounter for screening for HIV - CMP14+EGFR - CBC with Differential/Platelet - HIV Antibody (routine testing w rflx)  8. Colon cancer screening - CMP14+EGFR - CBC with Differential/Platelet - Ambulatory referral to Gastroenterology   Labs pending Pt reviewed in [place] controlled database- no red flags noted, has not refill Ultram since 08/19/18 Health Maintenance reviewed Diet and exercise encouraged  Follow up plan: 3 months    Evelina Dun, FNP

## 2018-12-26 NOTE — Patient Instructions (Signed)

## 2018-12-27 LAB — CMP14+EGFR
ALBUMIN: 4 g/dL (ref 4.0–5.0)
ALT: 20 IU/L (ref 0–44)
AST: 19 IU/L (ref 0–40)
Albumin/Globulin Ratio: 1.6 (ref 1.2–2.2)
Alkaline Phosphatase: 107 IU/L (ref 39–117)
BILIRUBIN TOTAL: 0.5 mg/dL (ref 0.0–1.2)
BUN / CREAT RATIO: 8 — AB (ref 9–20)
BUN: 7 mg/dL (ref 6–24)
CHLORIDE: 103 mmol/L (ref 96–106)
CO2: 21 mmol/L (ref 20–29)
CREATININE: 0.92 mg/dL (ref 0.76–1.27)
Calcium: 9.1 mg/dL (ref 8.7–10.2)
GFR calc non Af Amer: 97 mL/min/{1.73_m2} (ref >59)
GFR, EST AFRICAN AMERICAN: 112 mL/min/{1.73_m2} (ref >59)
GLUCOSE: 105 mg/dL — AB (ref 65–99)
Globulin, Total: 2.5 g/dL (ref 1.5–4.5)
Potassium: 3.6 mmol/L (ref 3.5–5.2)
Sodium: 139 mmol/L (ref 134–144)
TOTAL PROTEIN: 6.5 g/dL (ref 6.0–8.5)

## 2018-12-27 LAB — CBC WITH DIFFERENTIAL/PLATELET
BASOS ABS: 0 10*3/uL (ref 0.0–0.2)
Basos: 1 %
EOS (ABSOLUTE): 0.1 10*3/uL (ref 0.0–0.4)
EOS: 3 %
HEMATOCRIT: 45.2 % (ref 37.5–51.0)
HEMOGLOBIN: 15.5 g/dL (ref 13.0–17.7)
IMMATURE GRANS (ABS): 0 10*3/uL (ref 0.0–0.1)
Immature Granulocytes: 0 %
LYMPHS ABS: 1.3 10*3/uL (ref 0.7–3.1)
LYMPHS: 32 %
MCH: 30.2 pg (ref 26.6–33.0)
MCHC: 34.3 g/dL (ref 31.5–35.7)
MCV: 88 fL (ref 79–97)
MONOCYTES: 15 %
Monocytes Absolute: 0.6 10*3/uL (ref 0.1–0.9)
NEUTROS ABS: 2 10*3/uL (ref 1.4–7.0)
Neutrophils: 49 %
Platelets: 205 10*3/uL (ref 150–450)
RBC: 5.14 x10E6/uL (ref 4.14–5.80)
RDW: 12.9 % (ref 11.6–15.4)
WBC: 4.1 10*3/uL (ref 3.4–10.8)

## 2018-12-27 LAB — HIV ANTIBODY (ROUTINE TESTING W REFLEX): HIV SCREEN 4TH GENERATION: NONREACTIVE

## 2018-12-29 ENCOUNTER — Other Ambulatory Visit: Payer: Self-pay

## 2018-12-29 DIAGNOSIS — Z1211 Encounter for screening for malignant neoplasm of colon: Secondary | ICD-10-CM

## 2018-12-29 NOTE — Progress Notes (Unsigned)
gastr

## 2019-01-01 LAB — TOXASSURE SELECT 13 (MW), URINE

## 2019-03-30 ENCOUNTER — Ambulatory Visit: Payer: Medicare Other | Admitting: Family

## 2019-11-30 ENCOUNTER — Other Ambulatory Visit: Payer: Self-pay

## 2019-11-30 ENCOUNTER — Emergency Department (HOSPITAL_COMMUNITY)
Admission: EM | Admit: 2019-11-30 | Discharge: 2019-11-30 | Disposition: A | Discharge status: Home / Self Care | Payer: Medicare Other | Attending: Emergency Medicine | Admitting: Emergency Medicine

## 2019-11-30 ENCOUNTER — Emergency Department (HOSPITAL_COMMUNITY): Payer: Medicare Other

## 2019-11-30 ENCOUNTER — Encounter (HOSPITAL_COMMUNITY): Payer: Self-pay | Admitting: Emergency Medicine

## 2019-11-30 DIAGNOSIS — F17290 Nicotine dependence, other tobacco product, uncomplicated: Secondary | ICD-10-CM | POA: Insufficient documentation

## 2019-11-30 DIAGNOSIS — R0789 Other chest pain: Secondary | ICD-10-CM | POA: Insufficient documentation

## 2019-11-30 DIAGNOSIS — Z79899 Other long term (current) drug therapy: Secondary | ICD-10-CM | POA: Diagnosis not present

## 2019-11-30 DIAGNOSIS — J449 Chronic obstructive pulmonary disease, unspecified: Secondary | ICD-10-CM | POA: Insufficient documentation

## 2019-11-30 DIAGNOSIS — R0602 Shortness of breath: Secondary | ICD-10-CM | POA: Diagnosis not present

## 2019-11-30 DIAGNOSIS — R079 Chest pain, unspecified: Secondary | ICD-10-CM | POA: Diagnosis not present

## 2019-11-30 DIAGNOSIS — R05 Cough: Secondary | ICD-10-CM | POA: Diagnosis not present

## 2019-11-30 HISTORY — DX: Diaphragmatic hernia without obstruction or gangrene: K44.9

## 2019-11-30 LAB — TROPONIN I (HIGH SENSITIVITY)
Troponin I (High Sensitivity): <2 ng/L (ref <18)
Troponin I (High Sensitivity): <2 ng/L (ref <18)

## 2019-11-30 LAB — CBC
HCT: 45.1 % (ref 39.0–52.0)
Hemoglobin: 15.2 g/dL (ref 13.0–17.0)
MCH: 30 pg (ref 26.0–34.0)
MCHC: 33.7 g/dL (ref 30.0–36.0)
MCV: 89 fL (ref 80.0–100.0)
Platelets: 196 10*3/uL (ref 150–400)
RBC: 5.07 MIL/uL (ref 4.22–5.81)
RDW: 12.4 % (ref 11.5–15.5)
WBC: 6.1 10*3/uL (ref 4.0–10.5)
nRBC: 0 % (ref 0.0–0.2)

## 2019-11-30 LAB — BASIC METABOLIC PANEL
Anion gap: 10 (ref 5–15)
BUN: 10 mg/dL (ref 6–20)
CO2: 26 mmol/L (ref 22–32)
Calcium: 9.2 mg/dL (ref 8.9–10.3)
Chloride: 103 mmol/L (ref 98–111)
Creatinine, Ser: 0.85 mg/dL (ref 0.61–1.24)
GFR calc Af Amer: >60 mL/min (ref >60)
GFR calc non Af Amer: >60 mL/min (ref >60)
Glucose, Bld: 126 mg/dL — ABNORMAL HIGH (ref 70–99)
Potassium: 3.3 mmol/L — ABNORMAL LOW (ref 3.5–5.1)
Sodium: 139 mmol/L (ref 135–145)

## 2019-11-30 LAB — D-DIMER, QUANTITATIVE: D-Dimer, Quant: 0.38 ug/mL-FEU (ref 0.00–0.50)

## 2019-11-30 MED ORDER — ALUM & MAG HYDROXIDE-SIMETH 200-200-20 MG/5ML PO SUSP
30.0000 mL | Freq: Once | ORAL | Status: AC
  Administered 2019-11-30: 20:00:00 30 mL via ORAL
  Filled 2019-11-30: qty 30

## 2019-11-30 MED ORDER — KETOROLAC TROMETHAMINE 30 MG/ML IJ SOLN
30.0000 mg | Freq: Once | INTRAMUSCULAR | Status: AC
  Administered 2019-11-30: 30 mg via INTRAVENOUS
  Filled 2019-11-30: qty 1

## 2019-11-30 MED ORDER — NAPROXEN 500 MG PO TABS: 500.0000 mg | ORAL_TABLET | Freq: Two times a day (BID) | ORAL | 0 refills | Status: DC | PRN

## 2019-11-30 MED ORDER — LIDOCAINE VISCOUS HCL 2 % MT SOLN
15.0000 mL | Freq: Once | OROMUCOSAL | Status: AC
  Administered 2019-11-30: 20:00:00 15 mL via ORAL
  Filled 2019-11-30: qty 15

## 2019-11-30 NOTE — Discharge Instructions (Signed)
There is no evidence of heart attack.  As we discussed you are low risk for cardiac chest pain but not 0 risk.  You should follow-up with your doctor for a stress test.  Return to the ED if your chest pain becomes exertional, associated with shortness of breath, sweating, vomiting, any other concerns

## 2019-11-30 NOTE — ED Notes (Signed)
Patient ambulated lowest reading 93% on room air heart rate range from 83 to 95. No distress noted.

## 2019-11-30 NOTE — ED Provider Notes (Signed)
[facility] Provider Note   CSN: [id number] Arrival date & time: 11/30/19  1646     History Chief Complaint  Patient presents with  . Chest Pain    Warren Miller is a 49 y.o. male.  With history of COPD, tobacco abuse, anxiety disorder here with 3 days of chest pain.  Describes sharp stabbing pain in the center of his chest that radiates to his back.  It is somewhat better with ibuprofen.  It is worse with palpation and lying flat.  He denies any falls or trauma.  States he had a runny nose and cough several weeks ago but this improved does not have any cough or shortness of breath now.  He is describing central chest pain and soreness on his ribs bilaterally.  He denies any cough or fever currently.  Denies any leg pain or leg swelling.  There is no pain with deep breathing or exertion.  Denies any cardiac history.  States he had a stress test within the past 10 years that was reassuring. Denies any pain with urination or blood in the urine.  The history is provided by the patient.  Chest Pain Associated symptoms: cough, fatigue and shortness of breath   Associated symptoms: no abdominal pain, no dizziness, no fever, no headache, no nausea, no vomiting and no weakness        Past Medical History:  Diagnosis Date  . Anxiety disorder   . Chronic abdominal pain   . COPD (chronic obstructive pulmonary disease) ([place])   . Former heavy cigarette smoker (20-39 per day)   . GERD (gastroesophageal reflux disease)   . H. pylori infection   . Hiatal hernia     Patient Active Problem List   Diagnosis Date Noted  . Opiate dependence ([place]) 08/19/2018  . Controlled substance agreement signed 08/19/2018  . Erectile dysfunction 08/15/2015  . Wrist laceration 08/09/2015  . GERD (gastroesophageal reflux disease) 06/28/2015  . COPD (chronic obstructive pulmonary disease) ([place]) 06/28/2015  . GAD (generalized anxiety disorder) 06/28/2015  . Arrhythmia 06/27/2015  . BACK  PAIN, LUMBAR, CHRONIC 06/29/2010  . WEIGHT LOSS 06/29/2010  . CHANGE IN BOWELS 06/29/2010  . EPIGASTRIC PAIN 06/29/2010    Past Surgical History:  Procedure Laterality Date  . HERNIA REPAIR     x 2, inguinal, bilateral       Family History  Problem Relation Age of Onset  . Liver disease Mother   . Alcoholism Mother     Social History   Tobacco Use  . Smoking status: Current Every Day Smoker    Packs/day: 0.25    Types: Cigarettes    Last attempt to quit: 09/18/2004    Years since quitting: 15.2  . Smokeless tobacco: Current User    Types: Chew  . Tobacco comment: 2 or more cans smokeless per week, 09/07/15 smoking 4-5 cigs daily  Substance Use Topics  . Alcohol use: No    Alcohol/week: 0.0 standard drinks  . Drug use: No    Home Medications Prior to Admission medications   Medication Sig Start Date End Date Taking? Authorizing Provider  albuterol (PROVENTIL HFA;VENTOLIN HFA) 108 (90 Base) MCG/ACT inhaler Inhale 2 puffs into the lungs every 6 (six) hours as needed for wheezing or shortness of breath. 08/19/18   Sharion Balloon, FNP  budesonide-formoterol (SYMBICORT) 160-4.5 MCG/ACT inhaler Inhale 2 puffs into the lungs 2 (two) times daily. 08/19/18   Sharion Balloon, FNP  cyclobenzaprine (FLEXERIL) 10 MG tablet TAKE  1 TABLET BY MOUTH THREE TIMES DAILY AS NEEDED FOR MUSCLE SPASM 08/19/18   Evelina Dun A, FNP  DULoxetine (CYMBALTA) 60 MG capsule Take 1 capsule (60 mg total) by mouth daily. 09/19/18   Sharion Balloon, FNP  fluticasone (FLONASE) 50 MCG/ACT nasal spray Place 2 sprays into both nostrils daily. 12/26/18   Sharion Balloon, FNP  hydrOXYzine (ATARAX/VISTARIL) 25 MG tablet  08/21/16   [provider]  sildenafil (REVATIO) 20 MG tablet Take 2-4 pills once daily as needed for erectile dsyfunction 08/15/15   Timmothy Euler, MD  traMADol (ULTRAM) 50 MG tablet Take 1-2 tablets (50-100 mg total) by mouth every 12 (twelve) hours as needed. 12/26/18    Sharion Balloon, FNP    Allergies    Other and Tetracyclines & related  Review of Systems   Review of Systems  Constitutional: Positive for fatigue. Negative for activity change, appetite change and fever.  HENT: Positive for congestion and rhinorrhea. Negative for sore throat.   Respiratory: Positive for cough, chest tightness and shortness of breath.   Cardiovascular: Positive for chest pain.  Gastrointestinal: Negative for abdominal pain, nausea and vomiting.  Genitourinary: Negative for dysuria and hematuria.  Musculoskeletal: Negative for arthralgias, joint swelling and myalgias.  Neurological: Negative for dizziness, weakness and headaches.   all other systems are negative except as noted in the HPI and PMH.    Physical Exam Updated Vital Signs BP 134/68 (BP Location: Right Arm)   Pulse (!) 104   Temp 98.4 F (36.9 C) (Oral)   Resp 20   Ht 6\' 1"  (1.854 m)   Wt 83.5 kg   SpO2 100%   BMI 24.28 kg/m   Physical Exam Vitals and nursing note reviewed.  Constitutional:      General: He is not in acute distress.    Appearance: Normal appearance. He is well-developed and normal weight.  HENT:     Head: Normocephalic and atraumatic.     Mouth/Throat:     Pharynx: No oropharyngeal exudate.  Eyes:     Conjunctiva/sclera: Conjunctivae normal.     Pupils: Pupils are equal, round, and reactive to light.  Neck:     Comments: No meningismus. Cardiovascular:     Rate and Rhythm: Normal rate and regular rhythm.     Heart sounds: Normal heart sounds. No murmur.  Pulmonary:     Effort: Pulmonary effort is normal. No respiratory distress.     Breath sounds: Normal breath sounds.  Chest:     Chest wall: Tenderness present.  Abdominal:     Palpations: Abdomen is soft.     Tenderness: There is no abdominal tenderness. There is no guarding or rebound.  Musculoskeletal:        General: No tenderness. Normal range of motion.     Cervical back: Normal range of motion and neck  supple.  Skin:    General: Skin is warm.     Capillary Refill: Capillary refill takes less than 2 seconds.  Neurological:     General: No focal deficit present.     Mental Status: He is alert and oriented to person, place, and time. Mental status is at baseline.     Cranial Nerves: No cranial nerve deficit.     Motor: No abnormal muscle tone.     Coordination: Coordination normal.     Comments: No ataxia on finger to nose bilaterally. No pronator drift. 5/5 strength throughout. CN 2-12 intact.Equal grip strength. Sensation intact.   Psychiatric:  Behavior: Behavior normal.     ED Results / Procedures / Treatments   Labs (all labs ordered are listed, but only abnormal results are displayed) Labs Reviewed  BASIC METABOLIC PANEL - Abnormal; Notable for the following components:      Result Value   Potassium 3.3 (*)    Glucose, Bld 126 (*)    All other components within normal limits  CBC  D-DIMER, QUANTITATIVE (NOT AT [facility])  TROPONIN I (HIGH SENSITIVITY)  TROPONIN I (HIGH SENSITIVITY)    EKG EKG Interpretation  Date/Time:  Monday November 30 2019 19:53:03 EST Ventricular Rate:  72 PR Interval:    QRS Duration: 82 QT Interval:  382 QTC Calculation: 418 R Axis:   56 Text Interpretation: Sinus rhythm No significant change was found Confirmed by Ezequiel Essex [phone]) on 11/30/2019 7:56:16 PM   Radiology DG Chest Port 1 View  Result Date: 11/30/2019 CLINICAL DATA:  Chest pain x3 days with shortness of breath and cough. EXAM: PORTABLE CHEST 1 VIEW COMPARISON:  February 22, 2016 FINDINGS: The heart size and mediastinal contours are within normal limits. Both lungs are clear. The visualized skeletal structures are unremarkable. IMPRESSION: No active disease. Electronically Signed   By: Virgina Norfolk M.D.   On: 11/30/2019 20:33    Procedures Procedures (including critical care time)  Medications Ordered in ED Medications  alum & mag hydroxide-simeth (MAALOX/MYLANTA)  200-200-20 MG/5ML suspension 30 mL (has no administration in time range)    And  lidocaine (XYLOCAINE) 2 % viscous mouth solution 15 mL (has no administration in time range)  ketorolac (TORADOL) 30 MG/ML injection 30 mg (has no administration in time range)    ED Course  I have reviewed the triage vital signs and the nursing notes.  Pertinent labs & imaging results that were available during my care of the patient were reviewed by me and considered in my medical decision making (see chart for details).    MDM Rules/Calculators/A&P                     3 days of constant chest pain that is worse with palpation.  EKG is sinus rhythm without acute ST changes. Recently had URI and coughing.  Pain somewhat reproducible.  Chest x-ray is negative.  Troponin is negative and D-dimer is negative.  Has improved with GI cocktail and viscous lidocaine. Troponin negative x2.  Low suspicion for ACS.  Discussed with patient he is low risk for ACS but not 0 risk.  No evidence of MI.  Suspect musculoskeletal soreness after his recent cough and URI.  He needs to follow-up with his PCP as well as cardiology for potential stress test.  Advised smoking cessation.  Return to the ED if chest pain becomes exertional, associated with shortness of breath, nausea, vomiting, sweating, or other concerns.  Final Clinical Impression(s) / ED Diagnoses Final diagnoses:  Atypical chest pain    Rx / DC Orders ED Discharge Orders    None       Averill Pons, Annie Main, MD 11/30/19 2321

## 2019-11-30 NOTE — ED Triage Notes (Signed)
Pt reports chest pain x3 days. Pt reports shortness of breath and nausea. Pt reports productive cough prior to chest pain onset.

## 2020-06-29 DIAGNOSIS — F17210 Nicotine dependence, cigarettes, uncomplicated: Secondary | ICD-10-CM | POA: Diagnosis not present

## 2020-06-29 DIAGNOSIS — M7989 Other specified soft tissue disorders: Secondary | ICD-10-CM | POA: Diagnosis not present

## 2020-07-06 DIAGNOSIS — K219 Gastro-esophageal reflux disease without esophagitis: Secondary | ICD-10-CM | POA: Diagnosis not present

## 2020-07-06 DIAGNOSIS — F408 Other phobic anxiety disorders: Secondary | ICD-10-CM | POA: Diagnosis not present

## 2020-07-06 DIAGNOSIS — Z125 Encounter for screening for malignant neoplasm of prostate: Secondary | ICD-10-CM | POA: Diagnosis not present

## 2020-07-06 DIAGNOSIS — F3342 Major depressive disorder, recurrent, in full remission: Secondary | ICD-10-CM | POA: Diagnosis not present

## 2020-07-06 DIAGNOSIS — I209 Angina pectoris, unspecified: Secondary | ICD-10-CM | POA: Diagnosis not present

## 2020-07-06 DIAGNOSIS — E559 Vitamin D deficiency, unspecified: Secondary | ICD-10-CM | POA: Diagnosis not present

## 2020-07-13 DIAGNOSIS — I209 Angina pectoris, unspecified: Secondary | ICD-10-CM | POA: Diagnosis not present

## 2020-09-12 DIAGNOSIS — I209 Angina pectoris, unspecified: Secondary | ICD-10-CM | POA: Diagnosis not present

## 2020-09-12 DIAGNOSIS — Z0000 Encounter for general adult medical examination without abnormal findings: Secondary | ICD-10-CM | POA: Diagnosis not present

## 2020-09-12 DIAGNOSIS — F408 Other phobic anxiety disorders: Secondary | ICD-10-CM | POA: Diagnosis not present

## 2020-09-12 DIAGNOSIS — F3342 Major depressive disorder, recurrent, in full remission: Secondary | ICD-10-CM | POA: Diagnosis not present

## 2020-09-12 DIAGNOSIS — K219 Gastro-esophageal reflux disease without esophagitis: Secondary | ICD-10-CM | POA: Diagnosis not present

## 2021-06-23 ENCOUNTER — Encounter (HOSPITAL_COMMUNITY): Payer: Self-pay

## 2021-06-23 ENCOUNTER — Emergency Department (HOSPITAL_COMMUNITY)
Admission: EM | Admit: 2021-06-23 | Discharge: 2021-06-23 | Disposition: A | Discharge status: Home / Self Care | Payer: Medicare Other | Attending: Emergency Medicine | Admitting: Emergency Medicine

## 2021-06-23 ENCOUNTER — Emergency Department (HOSPITAL_COMMUNITY): Payer: Medicare Other

## 2021-06-23 ENCOUNTER — Other Ambulatory Visit: Payer: Self-pay

## 2021-06-23 DIAGNOSIS — M25511 Pain in right shoulder: Secondary | ICD-10-CM | POA: Diagnosis not present

## 2021-06-23 DIAGNOSIS — F17210 Nicotine dependence, cigarettes, uncomplicated: Secondary | ICD-10-CM | POA: Diagnosis not present

## 2021-06-23 DIAGNOSIS — M542 Cervicalgia: Secondary | ICD-10-CM | POA: Diagnosis not present

## 2021-06-23 DIAGNOSIS — F17220 Nicotine dependence, chewing tobacco, uncomplicated: Secondary | ICD-10-CM | POA: Insufficient documentation

## 2021-06-23 DIAGNOSIS — M5412 Radiculopathy, cervical region: Secondary | ICD-10-CM | POA: Diagnosis not present

## 2021-06-23 DIAGNOSIS — J449 Chronic obstructive pulmonary disease, unspecified: Secondary | ICD-10-CM | POA: Diagnosis not present

## 2021-06-23 DIAGNOSIS — Z7951 Long term (current) use of inhaled steroids: Secondary | ICD-10-CM | POA: Diagnosis not present

## 2021-06-23 LAB — I-STAT CHEM 8, ED
BUN: 12 mg/dL (ref 6–20)
Calcium, Ion: 1.17 mmol/L (ref 1.15–1.40)
Chloride: 105 mmol/L (ref 98–111)
Creatinine, Ser: 1 mg/dL (ref 0.61–1.24)
Glucose, Bld: 100 mg/dL — ABNORMAL HIGH (ref 70–99)
HCT: 40 % (ref 39.0–52.0)
Hemoglobin: 13.6 g/dL (ref 13.0–17.0)
Potassium: 3.8 mmol/L (ref 3.5–5.1)
Sodium: 141 mmol/L (ref 135–145)
TCO2: 25 mmol/L (ref 22–32)

## 2021-06-23 MED ORDER — METHYLPREDNISOLONE 4 MG PO TBPK: ORAL_TABLET | ORAL | 0 refills | Status: DC

## 2021-06-23 MED ORDER — HYDROCODONE-ACETAMINOPHEN 5-325 MG PO TABS
1.0000 | ORAL_TABLET | Freq: Once | ORAL | Status: AC
  Administered 2021-06-23: 1 via ORAL
  Filled 2021-06-23: qty 1

## 2021-06-23 MED ORDER — IOHEXOL 350 MG/ML SOLN
100.0000 mL | Freq: Once | INTRAVENOUS | Status: AC | PRN
  Administered 2021-06-23: 100 mL via INTRAVENOUS

## 2021-06-23 MED ORDER — METHOCARBAMOL 500 MG PO TABS
500.0000 mg | ORAL_TABLET | Freq: Once | ORAL | Status: AC
  Administered 2021-06-23: 500 mg via ORAL
  Filled 2021-06-23: qty 1

## 2021-06-23 MED ORDER — METHOCARBAMOL 500 MG PO TABS: 500.0000 mg | ORAL_TABLET | Freq: Three times a day (TID) | ORAL | 0 refills | Status: DC | PRN

## 2021-06-23 MED ORDER — PREDNISONE 50 MG PO TABS
60.0000 mg | ORAL_TABLET | Freq: Once | ORAL | Status: AC
  Administered 2021-06-23: 60 mg via ORAL
  Filled 2021-06-23: qty 1

## 2021-06-23 NOTE — ED Provider Notes (Signed)
[facility] EMERGENCY DEPARTMENT Provider Note   CSN: [id number] Arrival date & time: 06/23/21  0049     History Chief Complaint  Patient presents with   Shoulder Pain    Warren Miller is a 50 y.o. male.  Patient with history of tobacco abuse, COPD, anxiety presenting with right upper back, shoulder and neck pain.  States his right shoulder has been bothering him for "9 months".  Describes pain to the anterior and lateral shoulder that is worse with movement.  Denies any fall or injury.  Over the past 3 days the pain seems to have progressed to his trapezius area, upper back and side of his neck.  He describes constant pain for the past 3 days that waxes and wanes in severity.  Using ibuprofen with partial relief.  Denies any numbness or tingling in his arm.  Denies any radiation of the pain down his arm.  No weakness in the arm.  No chest pain or shortness of breath though hs is always somewhat short of breath from his anxiety which is unchanged.  No headache or visual changes.  No fever.  No pain with eye movement. He is concerned and anxious that something could be wrong with him  The history is provided by the patient.  Shoulder Pain Associated symptoms: back pain and neck pain   Associated symptoms: no fever       Past Medical History:  Diagnosis Date   Anxiety disorder    Chronic abdominal pain    COPD (chronic obstructive pulmonary disease) ([facility])    Former heavy cigarette smoker (20-39 per day)    GERD (gastroesophageal reflux disease)    H. pylori infection    Hiatal hernia     Patient Active Problem List   Diagnosis Date Noted   Opiate dependence ([place]) 08/19/2018   Controlled substance agreement signed 08/19/2018   Erectile dysfunction 08/15/2015   Wrist laceration 08/09/2015   GERD (gastroesophageal reflux disease) 06/28/2015   COPD (chronic obstructive pulmonary disease) ([place]) 06/28/2015   GAD (generalized anxiety disorder) 06/28/2015   Arrhythmia 06/27/2015    BACK PAIN, LUMBAR, CHRONIC 06/29/2010   WEIGHT LOSS 06/29/2010   CHANGE IN BOWELS 06/29/2010   EPIGASTRIC PAIN 06/29/2010    Past Surgical History:  Procedure Laterality Date   HERNIA REPAIR     x 2, inguinal, bilateral       Family History  Problem Relation Age of Onset   Liver disease Mother    Alcoholism Mother     Social History   Tobacco Use   Smoking status: Every Day    Packs/day: 0.25    Types: Cigarettes    Last attempt to quit: 09/18/2004    Years since quitting: 16.7   Smokeless tobacco: Current    Types: Chew   Tobacco comments:    2 or more cans smokeless per week, 09/07/15 smoking 4-5 cigs daily  Vaping Use   Vaping Use: Never used  Substance Use Topics   Alcohol use: No    Alcohol/week: 0.0 standard drinks   Drug use: No    Home Medications Prior to Admission medications   Medication Sig Start Date End Date Taking? Authorizing Provider  albuterol (PROVENTIL HFA;VENTOLIN HFA) 108 (90 Base) MCG/ACT inhaler Inhale 2 puffs into the lungs every 6 (six) hours as needed for wheezing or shortness of breath. 08/19/18   Sharion Balloon, FNP  budesonide-formoterol (SYMBICORT) 160-4.5 MCG/ACT inhaler Inhale 2 puffs into the lungs 2 (two) times daily. Patient  not taking: Reported on 11/30/2019 08/19/18   Sharion Balloon, FNP  Calcium Carbonate Antacid (ANTACID EXTRA STRENGTH PO) Take 1-2 tablets by mouth daily.    [provider]  naproxen (NAPROSYN) 500 MG tablet Take 1 tablet (500 mg total) by mouth 2 (two) times daily as needed. 11/30/19   Trista Ciocca, Annie Main, MD    Allergies    Other and Tetracyclines & related  Review of Systems   Review of Systems  Constitutional:  Negative for activity change, appetite change and fever.  HENT:  Negative for congestion and rhinorrhea.   Respiratory:  Negative for cough, chest tightness and shortness of breath.   Cardiovascular:  Negative for chest pain and leg swelling.  Gastrointestinal:  Negative for  abdominal pain, nausea and vomiting.  Genitourinary:  Negative for dysuria.  Musculoskeletal:  Positive for arthralgias, back pain, myalgias and neck pain.  Skin:  Negative for rash.  Neurological:  Negative for dizziness, weakness and headaches.   all other systems are negative except as noted in the HPI and PMH.   Physical Exam Updated Vital Signs BP 137/90   Pulse 74   Temp 98.7 F (37.1 C)   Resp 19   Ht '6\' 1"'$  (1.854 m)   Wt 83.9 kg   SpO2 97%   BMI 24.41 kg/m   Physical Exam Vitals and nursing note reviewed.  Constitutional:      General: He is not in acute distress.    Appearance: He is well-developed.     Comments: Anxious appearing  HENT:     Head: Normocephalic and atraumatic.     Mouth/Throat:     Pharynx: No oropharyngeal exudate.  Eyes:     Conjunctiva/sclera: Conjunctivae normal.     Pupils: Pupils are equal, round, and reactive to light.  Neck:     Comments: Full range of motion of the neck, no meningismus.  Tenderness to the right paraspinal musculature and upper trapezius and rhomboid area. Cardiovascular:     Rate and Rhythm: Normal rate and regular rhythm.     Heart sounds: Normal heart sounds. No murmur heard. Pulmonary:     Effort: Pulmonary effort is normal. No respiratory distress.     Breath sounds: Normal breath sounds.  Chest:     Chest wall: No tenderness.  Abdominal:     Palpations: Abdomen is soft.     Tenderness: There is no abdominal tenderness. There is no guarding or rebound.  Musculoskeletal:        General: Tenderness present.     Cervical back: Normal range of motion and neck supple.     Comments: Full range of motion of shoulder without pain.  Able to lift above head.  There is tenderness to trapezius and rhomboid and paraspinal cervical musculature.  Equal grip strength and radial pulses bilaterally  Skin:    General: Skin is warm.  Neurological:     General: No focal deficit present.     Mental Status: He is alert and  oriented to person, place, and time. Mental status is at baseline.     Cranial Nerves: No cranial nerve deficit.     Motor: No abnormal muscle tone.     Coordination: Coordination normal.     Comments:  5/5 strength throughout. CN 2-12 intact.Equal grip strength.   Psychiatric:        Behavior: Behavior normal.    ED Results / Procedures / Treatments   Labs (all labs ordered are listed, but only abnormal results  are displayed) Labs Reviewed - No data to display  EKG None  Radiology No results found.  Procedures Procedures   Medications Ordered in ED Medications  methocarbamol (ROBAXIN) tablet 500 mg (has no administration in time range)  predniSONE (DELTASONE) tablet 60 mg (has no administration in time range)  HYDROcodone-acetaminophen (NORCO/VICODIN) 5-325 MG per tablet 1 tablet (has no administration in time range)    ED Course  I have reviewed the triage vital signs and the nursing notes.  Pertinent labs & imaging results that were available during my care of the patient were reviewed by me and considered in my medical decision making (see chart for details).    MDM Rules/Calculators/A&P                           Right shoulder, upper back and neck pain ongoing for many months but worse over the past 3 days.  Neurologically intact.  Equal pulses and grip strength  Shoulder x-ray and C-spine x-ray are negative.  Suspect likely cervical radiculopathy.  Patient has no headache.  No weakness or numbness in his arm.  No indication for emergent MRI. Low suspicion for ACS, pulmonary embolism, aortic dissection.  Patient given prednisone as well as muscle relaxers with partial relief.  Reassurance given.  Patient quite concerned about the side of his neck pain and is "going to my brain".  Discussed risks and benefits of CT angiogram for further assessment of blood vessels.  He agrees to proceed.  CT angiogram head and neck is negative.  No evidence of carotid or  vertebral dissection or aneurysm.  Patient reassured.  He was given a course of steroids as well as muscle relaxers.  He is to establish care with PCP.  Discussed he may need MRI if his symptoms persist. Return to the ED sooner if progressive weakness in arm, worsening pain, numbness, tingling, any other concerns. Final Clinical Impression(s) / ED Diagnoses Final diagnoses:  Cervical radiculopathy    Rx / DC Orders ED Discharge Orders     None        Huberta Tompkins, Annie Main, MD 06/23/21 [phone]

## 2021-06-23 NOTE — ED Triage Notes (Signed)
Pt here pov from home with cc of right shoulder pain for 3 months . Says the last 3 days it has went up into the side of his neck. Says that it has made is anxiety go up thinking there is something wrong with him.

## 2021-06-23 NOTE — Discharge Instructions (Addendum)
Take the steroids and muscle relaxers as prescribed.  Follow-up with a primary care physician.  If you continue to have pain you may require an MRI. Return to the ED with worsening pain, weakness, numbness, tingling, chest pain, shortness of breath, or any other concerns.

## 2021-08-21 ENCOUNTER — Ambulatory Visit (INDEPENDENT_AMBULATORY_CARE_PROVIDER_SITE_OTHER): Payer: Medicare Other | Admitting: Family Medicine

## 2021-08-21 ENCOUNTER — Encounter: Payer: Self-pay | Admitting: Family Medicine

## 2021-08-21 ENCOUNTER — Other Ambulatory Visit: Payer: Self-pay | Admitting: Family Medicine

## 2021-08-21 ENCOUNTER — Other Ambulatory Visit: Payer: Self-pay

## 2021-08-21 ENCOUNTER — Ambulatory Visit (HOSPITAL_COMMUNITY)
Admission: RE | Admit: 2021-08-21 | Discharge: 2021-08-21 | Disposition: A | Discharge status: Home / Self Care | Payer: Medicare Other | Source: Ambulatory Visit | Attending: Family Medicine | Admitting: Family Medicine

## 2021-08-21 VITALS — BP 129/85 | HR 77 | Ht 73.0 in | Wt 186.0 lb

## 2021-08-21 DIAGNOSIS — N50811 Right testicular pain: Secondary | ICD-10-CM | POA: Diagnosis not present

## 2021-08-21 DIAGNOSIS — N503 Cyst of epididymis: Secondary | ICD-10-CM | POA: Diagnosis not present

## 2021-08-21 DIAGNOSIS — N433 Hydrocele, unspecified: Secondary | ICD-10-CM | POA: Diagnosis not present

## 2021-08-21 DIAGNOSIS — L729 Follicular cyst of the skin and subcutaneous tissue, unspecified: Secondary | ICD-10-CM

## 2021-08-21 DIAGNOSIS — Z8719 Personal history of other diseases of the digestive system: Secondary | ICD-10-CM | POA: Diagnosis not present

## 2021-08-21 NOTE — Patient Instructions (Signed)
[facility] [address] [place], [place] [address]

## 2021-08-21 NOTE — Progress Notes (Signed)
BP 129/85   Pulse 77   Ht 6\' 1"  (1.854 m)   Wt 186 lb (84.4 kg)   SpO2 99%   BMI 24.54 kg/m    Subjective:   Patient ID: Warren Miller, male    DOB: [birth year], 50 y.o.   MRN: [id number]  HPI: Warren Miller is a 50 y.o. male presenting on 08/21/2021 for Testicle Pain (Right- believes there is a cyst. Came up after hernia surgery 4-5 year ago per pt)   HPI Patient is coming in today complaining of right testicular pain and nodules.  He says he has had a small nodule there that is been slightly tender since his surgery 4 to 5 years ago where he had an inguinal mesh placed for inguinal hernia repair.  He says recently he feels like the knot is enlarging and his pain has been worse from it and it hurts now just above the right testicle and then radiates up into his right inguinal region.  He says the pain is becoming a lot more significant.  Relevant past medical, surgical, family and social history reviewed and updated as indicated. Interim medical history since our last visit reviewed. Allergies and medications reviewed and updated.  Review of Systems  Constitutional:  Negative for chills and fever.  Eyes:  Negative for visual disturbance.  Respiratory:  Negative for shortness of breath and wheezing.   Cardiovascular:  Negative for chest pain and leg swelling.  Gastrointestinal:  Negative for abdominal pain.  Genitourinary:  Positive for scrotal swelling and testicular pain. Negative for decreased urine volume, penile discharge, penile pain, penile swelling and urgency.  Musculoskeletal:  Negative for back pain and gait problem.  Skin:  Negative for rash.  All other systems reviewed and are negative.  Per HPI unless specifically indicated above   Allergies as of 08/21/2021       Reactions   Other Shortness Of Breath   Patient states he had a reaction (shortness of breath) after receiving an antibiotic for a H-pylori  infection in the past. Name of medication unknown. Patient  can take AMOXICILLIN.   Tetracyclines & Related    Patient thinks this is medication that he is allergic to        Medication List        Accurate as of August 21, 2021  9:02 AM. If you have any questions, ask your nurse or doctor.          STOP taking these medications    budesonide-formoterol 160-4.5 MCG/ACT inhaler Commonly known as: Symbicort Stopped by: Worthy Rancher, MD   methocarbamol 500 MG tablet Commonly known as: ROBAXIN Stopped by: Worthy Rancher, MD   methylPREDNISolone 4 MG Tbpk tablet Commonly known as: MEDROL DOSEPAK Stopped by: Warren Kaufmann Lulabelle Desta, MD   naproxen 500 MG tablet Commonly known as: NAPROSYN Stopped by: Worthy Rancher, MD       TAKE these medications    albuterol 108 (90 Base) MCG/ACT inhaler Commonly known as: VENTOLIN HFA Inhale 2 puffs into the lungs every 6 (six) hours as needed for wheezing or shortness of breath.   ANTACID EXTRA STRENGTH PO Take 1-2 tablets by mouth daily.   ibuprofen 200 MG tablet Commonly known as: ADVIL Take 200 mg by mouth every 6 (six) hours as needed.   vitamin C 500 MG tablet Commonly known as: ASCORBIC ACID Take 500 mg by mouth daily.         Objective:   BP 129/85  Pulse 77   Ht 6\' 1"  (1.854 m)   Wt 186 lb (84.4 kg)   SpO2 99%   BMI 24.54 kg/m   Wt Readings from Last 3 Encounters:  08/21/21 186 lb (84.4 kg)  06/23/21 185 lb (83.9 kg)  11/30/19 184 lb (83.5 kg)    Physical Exam Vitals and nursing note reviewed.  Constitutional:      General: He is not in acute distress.    Appearance: He is well-developed. He is not diaphoretic.  Abdominal:     Hernia: There is no hernia in the right inguinal area.  Genitourinary:    Pubic Area: No rash.      Penis: Circumcised.      Testes:        Right: Mass (Small firm nodule proximal to right testicle), tenderness and testicular hydrocele present. Swelling not present. Right testis is descended. Cremasteric reflex is  present.         Left: Mass or tenderness not present.     Epididymis:     Right: Normal.  Lymphadenopathy:     Lower Body: No right inguinal adenopathy.  Skin:    General: Skin is warm and dry.     Findings: No rash.  Neurological:     Mental Status: He is alert and oriented to person, place, and time.     Coordination: Coordination normal.      Assessment & Plan:   Problem List Items Addressed This Visit   None Visit Diagnoses     Right testicular pain    -  Primary   Relevant Orders   US SCROTUM W/DOPPLER       We will do testicular ultrasound, likely based on history a hydrocele will await the results from the ultrasound Follow up plan: Return if symptoms worsen or fail to improve.  Counseling provided for all of the vaccine components Orders Placed This Encounter  Procedures   US SCROTUM W/DOPPLER    Caryl Pina, MD [facility] 08/21/2021, 9:02 AM

## 2021-08-28 ENCOUNTER — Other Ambulatory Visit: Payer: Self-pay

## 2021-08-28 ENCOUNTER — Encounter: Payer: Self-pay | Admitting: Family Medicine

## 2021-08-28 ENCOUNTER — Ambulatory Visit (INDEPENDENT_AMBULATORY_CARE_PROVIDER_SITE_OTHER): Payer: Medicare Other | Admitting: Family Medicine

## 2021-08-28 VITALS — BP 130/86 | HR 89 | Temp 97.9°F | Ht 73.0 in | Wt 182.0 lb

## 2021-08-28 DIAGNOSIS — M5441 Lumbago with sciatica, right side: Secondary | ICD-10-CM

## 2021-08-28 DIAGNOSIS — G8929 Other chronic pain: Secondary | ICD-10-CM | POA: Diagnosis not present

## 2021-08-28 DIAGNOSIS — F411 Generalized anxiety disorder: Secondary | ICD-10-CM

## 2021-08-28 MED ORDER — QUETIAPINE FUMARATE 25 MG PO TABS: 25.0000 mg | ORAL_TABLET | Freq: Every day | ORAL | 1 refills | Status: DC

## 2021-08-28 NOTE — Progress Notes (Signed)
Subjective:  Patient ID: Genoveva Ill, male    DOB: 08-Sep-1971  Age: 50 y.o. MRN: [id number]  CC: check up  HPI GRIFFEY NICASIO presents for Right neck and shoulder pain. Right 4,5th fingers  go numb. Prefers to see pain clinic.   Having trouble with sleep. Feels a hard jerk, gets anxious when he lays his head down. Requests psych referral HPI ELLIOT MELDRUM presents for   Depression screen [facility] 2/9 08/28/2021 12/26/2018 09/19/2018  Decreased Interest 2 2 2   Down, Depressed, Hopeless 2 0 2  PHQ - 2 Score 4 2 4   Altered sleeping 3 1 2   Tired, decreased energy 2 3 2   Change in appetite 1 1 1   Feeling bad or failure about yourself  2 1 2   Trouble concentrating 2 3 3   Moving slowly or fidgety/restless 2 - 1  Suicidal thoughts 0 0 0  PHQ-9 Score 16 11 15   Difficult doing work/chores Very difficult - -    History Sajan has a past medical history of Anxiety disorder, Chronic abdominal pain, COPD (chronic obstructive pulmonary disease) ([place]), Former heavy cigarette smoker (20-39 per day), GERD (gastroesophageal reflux disease), H. pylori infection, and Hiatal hernia.   He has a past surgical history that includes Hernia repair.   His family history includes Alcoholism in his mother; Liver disease in his mother.He reports that he has been smoking cigarettes. He has been smoking an average of .25 packs per day. His smokeless tobacco use includes chew. He reports that he does not drink alcohol and does not use drugs.    ROS Review of Systems  Objective:  BP 130/86   Pulse 89   Temp 97.9 F (36.6 C)   Ht 6\' 1"  (1.854 m)   Wt 182 lb (82.6 kg)   SpO2 97%   BMI 24.01 kg/m   BP Readings from Last 3 Encounters:  08/28/21 130/86  08/21/21 129/85  06/23/21 123/86    Wt Readings from Last 3 Encounters:  08/28/21 182 lb (82.6 kg)  08/21/21 186 lb (84.4 kg)  06/23/21 185 lb (83.9 kg)     Physical Exam Vitals reviewed.  Constitutional:      Appearance: He is well-developed.   HENT:     Head: Normocephalic and atraumatic.     Right Ear: External ear normal.     Left Ear: External ear normal.     Mouth/Throat:     Pharynx: No oropharyngeal exudate or posterior oropharyngeal erythema.  Eyes:     Pupils: Pupils are equal, round, and reactive to light.  Cardiovascular:     Rate and Rhythm: Normal rate and regular rhythm.     Heart sounds: No murmur heard. Pulmonary:     Effort: No respiratory distress.     Breath sounds: Normal breath sounds.  Musculoskeletal:     Cervical back: Normal range of motion and neck supple.  Neurological:     Mental Status: He is alert and oriented to person, place, and time.      Assessment & Plan:   Aran was seen today for check up.  Diagnoses and all orders for this visit:  GAD (generalized anxiety disorder) -     Ambulatory referral to Psychiatry  Chronic bilateral low back pain with right-sided sciatica -     Ambulatory referral to Pain Clinic  Other orders -     QUEtiapine (SEROQUEL) 25 MG tablet; Take 1 tablet (25 mg total) by mouth at bedtime.  I am having Latricia Heft. Ferch start on QUEtiapine. I am also having him maintain his albuterol, Calcium Carbonate Antacid (ANTACID EXTRA STRENGTH PO), ibuprofen, and vitamin C.  Allergies as of 08/28/2021       Reactions   Other Shortness Of Breath   Patient states he had a reaction (shortness of breath) after receiving an antibiotic for a H-pylori  infection in the past. Name of medication unknown. Patient can take AMOXICILLIN.   Tetracyclines & Related    Patient thinks this is medication that he is allergic to        Medication List        Accurate as of August 28, 2021  8:54 PM. If you have any questions, ask your nurse or doctor.          albuterol 108 (90 Base) MCG/ACT inhaler Commonly known as: VENTOLIN HFA Inhale 2 puffs into the lungs every 6 (six) hours as needed for wheezing or shortness of breath.   ANTACID EXTRA STRENGTH PO Take  1-2 tablets by mouth daily.   ibuprofen 200 MG tablet Commonly known as: ADVIL Take 200 mg by mouth every 6 (six) hours as needed.   QUEtiapine 25 MG tablet Commonly known as: SEROQUEL Take 1 tablet (25 mg total) by mouth at bedtime. Started by: Claretta Fraise, MD   vitamin C 500 MG tablet Commonly known as: ASCORBIC ACID Take 500 mg by mouth daily.         Follow-up: Return if symptoms worsen or fail to improve.  Claretta Fraise, M.D.

## 2021-09-07 ENCOUNTER — Telehealth: Payer: Self-pay | Admitting: Family

## 2021-09-07 NOTE — Telephone Encounter (Signed)
Left message for patient to call back and schedule Medicare Annual Wellness Visit (AWV) to be completed by video or phone.   Last AWV: 10//31/2021 per [facility]   Please schedule at anytime with [facility] Health Advisor.  45 minute appointment  Any questions, please contact me at [phone]

## 2021-10-06 DIAGNOSIS — N4341 Spermatocele of epididymis, single: Secondary | ICD-10-CM | POA: Diagnosis not present

## 2021-10-06 DIAGNOSIS — N5082 Scrotal pain: Secondary | ICD-10-CM | POA: Diagnosis not present

## 2021-11-06 ENCOUNTER — Telehealth: Payer: Self-pay | Admitting: Family

## 2021-11-06 NOTE — Telephone Encounter (Signed)
Left message for patient to call back and schedule Medicare Annual Wellness Visit (AWV) to be completed by video or phone.   Last AWV: 08/28/2020 [facility] per [facility]   Please schedule at anytime with [place]  45 minute appointment  Any questions, please contact me at [phone]

## 2021-11-10 ENCOUNTER — Encounter: Payer: Self-pay | Admitting: Physical Medicine and Rehabilitation

## 2021-11-13 ENCOUNTER — Ambulatory Visit (INDEPENDENT_AMBULATORY_CARE_PROVIDER_SITE_OTHER): Payer: Medicare Other

## 2021-11-13 VITALS — Ht 73.0 in | Wt 182.0 lb

## 2021-11-13 DIAGNOSIS — Z0000 Encounter for general adult medical examination without abnormal findings: Secondary | ICD-10-CM | POA: Diagnosis not present

## 2021-11-13 NOTE — Progress Notes (Signed)
Subjective:   Warren Miller is a 51 y.o. male who presents for Medicare Annual/Subsequent preventive examination.  Virtual Visit via Telephone Note  I connected with  DERAK SCHURMAN on 11/13/21 at  3:30 PM EST by telephone and verified that I am speaking with the correct person using two identifiers.  Location: Patient: Home Provider: [facility] Persons participating in the virtual visit: patient/Nurse Health Advisor   I discussed the limitations, risks, security and privacy concerns of performing an evaluation and management service by telephone and the availability of in person appointments. The patient expressed understanding and agreed to proceed.  Interactive audio and video telecommunications were attempted between this nurse and patient, however failed, due to patient having technical difficulties OR patient did not have access to video capability.  We continued and completed visit with audio only.  Some vital signs may be absent or patient reported.   Sharday Michl E Nela Bascom, LPN   Review of Systems     Cardiac Risk Factors include: male gender;sedentary lifestyle;Other (see comment);smoking/ tobacco exposure, Risk factor comments: COPD     Objective:    Today's Vitals   11/13/21 1526 11/13/21 1528  Weight: 182 lb (82.6 kg)   Height: 6\' 1"  (1.854 m)   PainSc:  2    Body mass index is 24.01 kg/m.  Advanced Directives 06/23/2021 11/30/2019 08/23/2017 02/22/2016 09/07/2015 06/29/2015  Does Patient Have a Medical Advance Directive? No No No No No No  Would patient like information on creating a medical advance directive? - - - No - patient declined information No - patient declined information No - patient declined information    Current Medications (verified) Outpatient Encounter Medications as of 11/13/2021  Medication Sig   ibuprofen (ADVIL) 200 MG tablet Take 200 mg by mouth every 6 (six) hours as needed.   albuterol (PROVENTIL HFA;VENTOLIN HFA) 108 (90 Base) MCG/ACT inhaler Inhale  2 puffs into the lungs every 6 (six) hours as needed for wheezing or shortness of breath. (Patient not taking: Reported on 08/28/2021)   Calcium Carbonate Antacid (ANTACID EXTRA STRENGTH PO) Take 1-2 tablets by mouth daily. (Patient not taking: Reported on 08/28/2021)   QUEtiapine (SEROQUEL) 25 MG tablet Take 1 tablet (25 mg total) by mouth at bedtime. (Patient not taking: Reported on 11/13/2021)   vitamin C (ASCORBIC ACID) 500 MG tablet Take 500 mg by mouth daily. (Patient not taking: Reported on 08/28/2021)   No facility-administered encounter medications on file as of 11/13/2021.    Allergies (verified) Other and Tetracyclines & related   History: Past Medical History:  Diagnosis Date   Anxiety disorder    Chronic abdominal pain    COPD (chronic obstructive pulmonary disease) ([facility])    Former heavy cigarette smoker (20-39 per day)    GERD (gastroesophageal reflux disease)    H. pylori infection    Hiatal hernia    Past Surgical History:  Procedure Laterality Date   HERNIA REPAIR     x 2, inguinal, bilateral   Family History  Problem Relation Age of Onset   Liver disease Mother    Alcoholism Mother    Social History   Socioeconomic History   Marital status: Legally Separated    Spouse name: Kenney Houseman   Number of children: 2   Years of education: 12   Highest education level: Not on file  Occupational History   Occupation: disabled  Tobacco Use   Smoking status: Former    Packs/day: 0.25    Types: Cigarettes  Quit date: 09/18/2004    Years since quitting: 17.1   Smokeless tobacco: Current    Types: Chew   Tobacco comments:    2 or more cans smokeless per week, 09/07/15 smoking 4-5 cigs daily - quit  Vaping Use   Vaping Use: Never used  Substance and Sexual Activity   Alcohol use: No    Alcohol/week: 0.0 standard drinks   Drug use: No   Sexual activity: Yes    Birth control/protection: None  Other Topics Concern   Not on file  Social History Narrative   Lives  alone , 2 children   Sometimes his son stays with him   Caffeine use- none to rare   Chews tobacco   Social Determinants of Health   Financial Resource Strain: Low Risk    Difficulty of Paying Living Expenses: Not very hard  Food Insecurity: No Food Insecurity   Worried About Charity fundraiser in the Last Year: Never true   Ran Out of Food in the Last Year: Never true  Transportation Needs: No Transportation Needs   Lack of Transportation (Medical): No   Lack of Transportation (Non-Medical): No  Physical Activity: Inactive   Days of Exercise per Week: 0 days   Minutes of Exercise per Session: 0 min  Stress: Stress Concern Present   Feeling of Stress : Rather much  Social Connections: Socially Isolated   Frequency of Communication with Friends and Family: More than three times a week   Frequency of Social Gatherings with Friends and Family: Twice a week   Attends Religious Services: Never   Marine scientist or Organizations: No   Attends Music therapist: Never   Marital Status: Divorced    Tobacco Counseling Ready to quit: No Counseling given: Yes Tobacco comments: 2 or more cans smokeless per week, 09/07/15 smoking 4-5 cigs daily - quit   Clinical Intake:  Pre-visit preparation completed: Yes  Pain : 0-10 Pain Score: 2  Pain Type: Chronic pain Pain Location: Hip Pain Orientation: Right Pain Descriptors / Indicators: Aching, Burning, Sharp Pain Onset: 1 to 4 weeks ago Pain Frequency: Intermittent     BMI - recorded: 24.01 Nutritional Status: BMI of 19-24  Normal Nutritional Risks: None Diabetes: No  How often do you need to have someone help you when you read instructions, pamphlets, or other written materials from your doctor or pharmacy?: 1 - Never  Diabetic? no  Interpreter Needed?: No  Information entered by :: Supriya Beaston, LPN   Activities of Daily Living In your present state of health, do you have any difficulty performing  the following activities: 11/13/2021  Hearing? N  Vision? N  Difficulty concentrating or making decisions? Y  Walking or climbing stairs? Y  Dressing or bathing? N  Doing errands, shopping? N  Preparing Food and eating ? N  Using the Toilet? N  In the past six months, have you accidently leaked urine? N  Do you have problems with loss of bowel control? N  Managing your Medications? N  Managing your Finances? N  Housekeeping or managing your Housekeeping? N  Some recent data might be hidden    Patient Care Team: Sharion Balloon, FNP as PCP - General (Family Medicine)  Indicate any recent Medical Services you may have received from other than [facility] providers in the past year (date may be approximate).     Assessment:   This is a routine wellness examination for [place].  Hearing/Vision screen Hearing Screening -  Comments:: Denies hearing difficulties  Vision Screening - Comments:: Denies vision difficulties  - no eye doctor at this time  Dietary issues and exercise activities discussed: Current Exercise Habits: The patient does not participate in regular exercise at present, Exercise limited by: orthopedic condition(s);psychological condition(s)   Goals Addressed             This Visit's Progress    Stop or Cut Down Tobacco Use       Timeframe:  Long-Range Goal Priority:  High Start Date:                             Expected End Date:                       Follow Up Date 11/14/2022    - cut down amount of tobacco product used (chew, cigars) - drink 4-6 glasses of water each day - use over-the-counter gum, patch or lozenges    Why is this important?   To stop or cut down it is important to have support from a person or group of people who you can count on.  You will also need to think about the things that make you feel like smoking, then plan for how to handle them.    Notes:        Depression Screen PHQ 2/9 Scores 11/13/2021 08/28/2021 12/26/2018 09/19/2018  08/19/2018 07/31/2017 05/29/2017  PHQ - 2 Score 4 4 2 4 6 6  0  PHQ- 9 Score 15 16 11 15 21 16  -  Exception Documentation - - - - - - -  Not completed - - - - - - -    Fall Risk Fall Risk  11/13/2021 05/29/2017 09/07/2015 08/15/2015 06/01/2015  Falls in the past year? 1 No Yes Yes No  Number falls in past yr: 0 - 1 1 -  Injury with Fall? 1 - Yes Yes -  Comment - - injured right wrist wrist -  Risk for fall due to : History of fall(s);Impaired balance/gait;Orthopedic patient - Impaired mobility - -  Risk for fall due to: Comment - - tripped on porch, didn't pick up R leg far enough - -  Follow up Falls prevention discussed - - - -    FALL RISK PREVENTION PERTAINING TO THE HOME:  Any stairs in or around the home? No  If so, are there any without handrails? No  Home free of loose throw rugs in walkways, pet beds, electrical cords, etc? Yes  Adequate lighting in your home to reduce risk of falls? Yes   ASSISTIVE DEVICES UTILIZED TO PREVENT FALLS:  Life alert? No  Use of a cane, walker or w/c? No  Grab bars in the bathroom? No  Shower chair or bench in shower? No  Elevated toilet seat or a handicapped toilet? No   TIMED UP AND GO:  Was the test performed? No . Telephonic visit  Cognitive Function:     6CIT Screen 11/13/2021  What Year? 0 points  What month? 0 points  What time? 0 points  Count back from 20 0 points  Months in reverse 0 points  Repeat phrase 4 points  Total Score 4    Immunizations Immunization History  Administered Date(s) Administered   Pneumococcal Polysaccharide-23 12/29/2013   Tdap 08/08/2012, 12/29/2013, 07/29/2015    TDAP status: Up to date  Flu Vaccine status: Declined, Education has been provided regarding the importance  of this vaccine but patient still declined. Advised may receive this vaccine at local pharmacy or Health Dept. Aware to provide a copy of the vaccination record if obtained from local pharmacy or Health Dept. Verbalized acceptance  and understanding.  Pneumococcal vaccine status: Declined,  Education has been provided regarding the importance of this vaccine but patient still declined. Advised may receive this vaccine at local pharmacy or Health Dept. Aware to provide a copy of the vaccination record if obtained from local pharmacy or Health Dept. Verbalized acceptance and understanding.   Covid-19 vaccine status: Declined, Education has been provided regarding the importance of this vaccine but patient still declined. Advised may receive this vaccine at local pharmacy or Health Dept.or vaccine clinic. Aware to provide a copy of the vaccination record if obtained from local pharmacy or Health Dept. Verbalized acceptance and understanding.  Qualifies for Shingles Vaccine? Yes   Zostavax completed No   Shingrix Completed?: No.    Education has been provided regarding the importance of this vaccine. Patient has been advised to call insurance company to determine out of pocket expense if they have not yet received this vaccine. Advised may also receive vaccine at local pharmacy or Health Dept. Verbalized acceptance and understanding.  Screening Tests Health Maintenance  Topic Date Due   COVID-19 Vaccine (1) Never done   Hepatitis C Screening  Never done   Zoster Vaccines- Shingrix (1 of 2) Never done   COLONOSCOPY (Pts 45-53yrs Insurance coverage will need to be confirmed)  Never done   Pneumococcal Vaccine 67-43 Years old (2 - PCV) 12/30/2014   INFLUENZA VACCINE  Never done   TETANUS/TDAP  07/28/2025   HIV Screening  Completed   HPV VACCINES  Aged Out    Health Maintenance  Health Maintenance Due  Topic Date Due   COVID-19 Vaccine (1) Never done   Hepatitis C Screening  Never done   Zoster Vaccines- Shingrix (1 of 2) Never done   COLONOSCOPY (Pts 45-42yrs Insurance coverage will need to be confirmed)  Never done   Pneumococcal Vaccine 63-54 Years old (2 - PCV) 12/30/2014   INFLUENZA VACCINE  Never done     Colorectal cancer screening: declined - he plans to do this after he has surgery this year  Lung Cancer Screening: (Low Dose CT Chest recommended if Age 109-80 years, 30 pack-year currently smoking OR have quit w/in 15years.) does not qualify.   Additional Screening:  Hepatitis C Screening: does not qualify  Vision Screening: Recommended annual ophthalmology exams for early detection of glaucoma and other disorders of the eye. Is the patient up to date with their annual eye exam?  No  Who is the provider or what is the name of the office in which the patient attends annual eye exams? none If pt is not established with a provider, would they like to be referred to a provider to establish care? No .   Dental Screening: Recommended annual dental exams for proper oral hygiene  Community Resource Referral / Chronic Care Management: CRR required this visit?  No   CCM required this visit?  No      Plan:     I have personally reviewed and noted the following in the patients chart:   Medical and social history Use of alcohol, tobacco or illicit drugs  Current medications and supplements including opioid prescriptions. Patient is not currently taking opioid prescriptions. Functional ability and status Nutritional status Physical activity Advanced directives List of other physicians Hospitalizations, surgeries, and  ER visits in previous 12 months Vitals Screenings to include cognitive, depression, and falls Referrals and appointments  In addition, I have reviewed and discussed with patient certain preventive protocols, quality metrics, and best practice recommendations. A written personalized care plan for preventive services as well as general preventive health recommendations were provided to patient.     Sandrea Hammond, LPN   07/20/3008   Nurse Notes: Would like to try taking Hydroxyzine again - it helps him sleep

## 2021-11-13 NOTE — Patient Instructions (Signed)
Warren Miller , Thank you for taking time to come for your Medicare Wellness Visit. I appreciate your ongoing commitment to your health goals. Please review the following plan we discussed and let me know if I can assist you in the future.   Screening recommendations/referrals: Colonoscopy: Due - consider this after you have surgery and feel better Recommended yearly ophthalmology/optometry visit for glaucoma screening and checkup Recommended yearly dental visit for hygiene and checkup  Vaccinations: Influenza vaccine: Declined Pneumococcal vaccine: Done 12/29/2013 - get Prevnar at age 51 Tdap vaccine: Done 07/29/2015 - Repeat in 10 years Shingles vaccine: Due   Covid-19: Declined  Advanced directives: Advance directive discussed with you today. Even though you declined this today, please call our office should you change your mind, and we can give you the proper paperwork for you to fill out.   Conditions/risks identified: Aim for 30 minutes of exercise or brisk walking each day, drink 6-8 glasses of water and eat lots of fruits and vegetables.   Next appointment: Follow up in one year for your annual wellness visit   Preventive Care 40-64 Years, Male Preventive care refers to lifestyle choices and visits with your health care provider that can promote health and wellness. What does preventive care include? A yearly physical exam. This is also called an annual well check. Dental exams once or twice a year. Routine eye exams. Ask your health care provider how often you should have your eyes checked. Personal lifestyle choices, including: Daily care of your teeth and gums. Regular physical activity. Eating a healthy diet. Avoiding tobacco and drug use. Limiting alcohol use. Practicing safe sex. Taking low-dose aspirin every day starting at age 53. What happens during an annual well check? The services and screenings done by your health care provider during your annual well check will  depend on your age, overall health, lifestyle risk factors, and family history of disease. Counseling  Your health care provider may ask you questions about your: Alcohol use. Tobacco use. Drug use. Emotional well-being. Home and relationship well-being. Sexual activity. Eating habits. Work and work Statistician. Screening  You may have the following tests or measurements: Height, weight, and BMI. Blood pressure. Lipid and cholesterol levels. These may be checked every 5 years, or more frequently if you are over 65 years old. Skin check. Lung cancer screening. You may have this screening every year starting at age 53 if you have a 30-pack-year history of smoking and currently smoke or have quit within the past 15 years. Fecal occult blood test (FOBT) of the stool. You may have this test every year starting at age 70. Flexible sigmoidoscopy or colonoscopy. You may have a sigmoidoscopy every 5 years or a colonoscopy every 10 years starting at age 40. Prostate cancer screening. Recommendations will vary depending on your family history and other risks. Hepatitis C blood test. Hepatitis B blood test. Sexually transmitted disease (STD) testing. Diabetes screening. This is done by checking your blood sugar (glucose) after you have not eaten for a while (fasting). You may have this done every 1-3 years. Discuss your test results, treatment options, and if necessary, the need for more tests with your health care provider. Vaccines  Your health care provider may recommend certain vaccines, such as: Influenza vaccine. This is recommended every year. Tetanus, diphtheria, and acellular pertussis (Tdap, Td) vaccine. You may need a Td booster every 10 years. Zoster vaccine. You may need this after age 53. Pneumococcal 13-valent conjugate (PCV13) vaccine. You may need this  if you have certain conditions and have not been vaccinated. Pneumococcal polysaccharide (PPSV23) vaccine. You may need one or  two doses if you smoke cigarettes or if you have certain conditions. Talk to your health care provider about which screenings and vaccines you need and how often you need them. This information is not intended to replace advice given to you by your health care provider. Make sure you discuss any questions you have with your health care provider. Document Released: 11/11/2015 Document Revised: 07/04/2016 Document Reviewed: 08/16/2015 Elsevier Interactive Patient Education  2017 [place] Prevention in the Home Falls can cause injuries. They can happen to people of all ages. There are many things you can do to make your home safe and to help prevent falls. What can I do on the outside of my home? Regularly fix the edges of walkways and driveways and fix any cracks. Remove anything that might make you trip as you walk through a door, such as a raised step or threshold. Trim any bushes or trees on the path to your home. Use bright outdoor lighting. Clear any walking paths of anything that might make someone trip, such as rocks or tools. Regularly check to see if handrails are loose or broken. Make sure that both sides of any steps have handrails. Any raised decks and porches should have guardrails on the edges. Have any leaves, snow, or ice cleared regularly. Use sand or salt on walking paths during winter. Clean up any spills in your garage right away. This includes oil or grease spills. What can I do in the bathroom? Use night lights. Install grab bars by the toilet and in the tub and shower. Do not use towel bars as grab bars. Use non-skid mats or decals in the tub or shower. If you need to sit down in the shower, use a plastic, non-slip stool. Keep the floor dry. Clean up any water that spills on the floor as soon as it happens. Remove soap buildup in the tub or shower regularly. Attach bath mats securely with double-sided non-slip rug tape. Do not have throw rugs and other  things on the floor that can make you trip. What can I do in the bedroom? Use night lights. Make sure that you have a light by your bed that is easy to reach. Do not use any sheets or blankets that are too big for your bed. They should not hang down onto the floor. Have a firm chair that has side arms. You can use this for support while you get dressed. Do not have throw rugs and other things on the floor that can make you trip. What can I do in the kitchen? Clean up any spills right away. Avoid walking on wet floors. Keep items that you use a lot in easy-to-reach places. If you need to reach something above you, use a strong step stool that has a grab bar. Keep electrical cords out of the way. Do not use floor polish or wax that makes floors slippery. If you must use wax, use non-skid floor wax. Do not have throw rugs and other things on the floor that can make you trip. What can I do with my stairs? Do not leave any items on the stairs. Make sure that there are handrails on both sides of the stairs and use them. Fix handrails that are broken or loose. Make sure that handrails are as long as the stairways. Check any carpeting to make sure that it  is firmly attached to the stairs. Fix any carpet that is loose or worn. Avoid having throw rugs at the top or bottom of the stairs. If you do have throw rugs, attach them to the floor with carpet tape. Make sure that you have a light switch at the top of the stairs and the bottom of the stairs. If you do not have them, ask someone to add them for you. What else can I do to help prevent falls? Wear shoes that: Do not have high heels. Have rubber bottoms. Are comfortable and fit you well. Are closed at the toe. Do not wear sandals. If you use a stepladder: Make sure that it is fully opened. Do not climb a closed stepladder. Make sure that both sides of the stepladder are locked into place. Ask someone to hold it for you, if possible. Clearly  mark and make sure that you can see: Any grab bars or handrails. First and last steps. Where the edge of each step is. Use tools that help you move around (mobility aids) if they are needed. These include: Canes. Walkers. Scooters. Crutches. Turn on the lights when you go into a dark area. Replace any light bulbs as soon as they burn out. Set up your furniture so you have a clear path. Avoid moving your furniture around. If any of your floors are uneven, fix them. If there are any pets around you, be aware of where they are. Review your medicines with your doctor. Some medicines can make you feel dizzy. This can increase your chance of falling. Ask your doctor what other things that you can do to help prevent falls. This information is not intended to replace advice given to you by your health care provider. Make sure you discuss any questions you have with your health care provider. Document Released: 08/11/2009 Document Revised: 03/22/2016 Document Reviewed: 11/19/2014 Elsevier Interactive Patient Education  2017 [employer].

## 2021-11-20 ENCOUNTER — Ambulatory Visit (INDEPENDENT_AMBULATORY_CARE_PROVIDER_SITE_OTHER): Payer: Medicare Other

## 2021-11-20 ENCOUNTER — Encounter: Payer: Self-pay | Admitting: Family

## 2021-11-20 ENCOUNTER — Ambulatory Visit (INDEPENDENT_AMBULATORY_CARE_PROVIDER_SITE_OTHER): Payer: Medicare Other | Admitting: Family

## 2021-11-20 VITALS — BP 147/97 | HR 82 | Temp 97.3°F | Ht 73.0 in | Wt 185.0 lb

## 2021-11-20 DIAGNOSIS — J449 Chronic obstructive pulmonary disease, unspecified: Secondary | ICD-10-CM | POA: Diagnosis not present

## 2021-11-20 DIAGNOSIS — Z1211 Encounter for screening for malignant neoplasm of colon: Secondary | ICD-10-CM

## 2021-11-20 DIAGNOSIS — F411 Generalized anxiety disorder: Secondary | ICD-10-CM

## 2021-11-20 DIAGNOSIS — R1031 Right lower quadrant pain: Secondary | ICD-10-CM | POA: Diagnosis not present

## 2021-11-20 DIAGNOSIS — K5900 Constipation, unspecified: Secondary | ICD-10-CM | POA: Diagnosis not present

## 2021-11-20 DIAGNOSIS — J441 Chronic obstructive pulmonary disease with (acute) exacerbation: Secondary | ICD-10-CM | POA: Diagnosis not present

## 2021-11-20 LAB — URINALYSIS, COMPLETE
Bilirubin, UA: NEGATIVE
Glucose, UA: NEGATIVE
Ketones, UA: NEGATIVE
Leukocytes,UA: NEGATIVE
Nitrite, UA: NEGATIVE
Protein,UA: NEGATIVE
Specific Gravity, UA: 1.015 (ref 1.005–1.030)
Urobilinogen, Ur: 0.2 mg/dL (ref 0.2–1.0)
pH, UA: 6 (ref 5.0–7.5)

## 2021-11-20 LAB — MICROSCOPIC EXAMINATION
RBC, Urine: NONE SEEN /hpf (ref 0–2)
Renal Epithel, UA: NONE SEEN /hpf
WBC, UA: NONE SEEN /hpf (ref 0–5)

## 2021-11-20 MED ORDER — HYDROXYZINE PAMOATE 25 MG PO CAPS: 25.0000 mg | ORAL_CAPSULE | Freq: Three times a day (TID) | ORAL | 2 refills | Status: DC | PRN

## 2021-11-20 MED ORDER — ALBUTEROL SULFATE HFA 108 (90 BASE) MCG/ACT IN AERS: 2.0000 | INHALATION_SPRAY | Freq: Four times a day (QID) | RESPIRATORY_TRACT | 4 refills | Status: AC | PRN

## 2021-11-20 NOTE — Patient Instructions (Signed)

## 2021-11-20 NOTE — Progress Notes (Signed)
Subjective:    Patient ID: Warren Miller, male    DOB: [birth year], 51 y.o.   MRN: [id number]  Chief Complaint  Patient presents with   Flank Pain    Right side been going for a while.    Medical Management of Chronic Issues    Wants hydroxzine    PT presents to the office today with right lower abdominal pain. He went to the Urologists and was told to follow up here.   She is also requesting refill albuterol. She has COPD and quit smoking in 2005.  Abdominal Pain This is a new problem. The current episode started more than 1 year ago. The onset quality is gradual. The problem has been gradually worsening. The pain is located in the RLQ. The pain is at a severity of 2/10 (but at times it can be 7 out 10). The pain is moderate. The abdominal pain does not radiate. Associated symptoms include constipation. Pertinent negatives include no belching, diarrhea, dysuria, flatus, frequency, headaches, hematuria, nausea or vomiting. Nothing aggravates the pain. The pain is relieved by Nothing. He has tried acetaminophen for the symptoms. The treatment provided no relief.  Anxiety Presents for follow-up visit. Symptoms include depressed mood, excessive worry, irritability, nervous/anxious behavior, palpitations and restlessness. Patient reports no nausea. Symptoms occur occasionally.       Review of Systems  Constitutional:  Positive for irritability.  Cardiovascular:  Positive for palpitations.  Gastrointestinal:  Positive for abdominal pain and constipation. Negative for diarrhea, flatus, nausea and vomiting.  Genitourinary:  Negative for dysuria, frequency and hematuria.  Neurological:  Negative for headaches.  Psychiatric/Behavioral:  The patient is nervous/anxious.   All other systems reviewed and are negative.     Objective:   Physical Exam Vitals reviewed.  Constitutional:      General: He is not in acute distress.    Appearance: He is well-developed.  HENT:     Head:  Normocephalic.     Right Ear: Tympanic membrane normal.     Left Ear: Tympanic membrane normal.  Eyes:     General:        Right eye: No discharge.        Left eye: No discharge.     Pupils: Pupils are equal, round, and reactive to light.  Neck:     Thyroid: No thyromegaly.  Cardiovascular:     Rate and Rhythm: Normal rate and regular rhythm.     Heart sounds: Normal heart sounds. No murmur heard. Pulmonary:     Effort: Pulmonary effort is normal. No respiratory distress.     Breath sounds: Normal breath sounds. No wheezing.  Abdominal:     General: Bowel sounds are normal. There is no distension.     Palpations: Abdomen is soft.     Tenderness: There is no abdominal tenderness (mild tenderness of RLQ).  Musculoskeletal:        General: No tenderness. Normal range of motion.     Cervical back: Normal range of motion and neck supple.  Skin:    General: Skin is warm and dry.     Findings: No erythema or rash.  Neurological:     Mental Status: He is alert and oriented to person, place, and time.     Cranial Nerves: No cranial nerve deficit.     Deep Tendon Reflexes: Reflexes are normal and symmetric.  Psychiatric:        Behavior: Behavior normal.        Thought Content:  Thought content normal.        Judgment: Judgment normal.    BP (!) 147/97    Pulse 82    Temp (!) 97.3 F (36.3 C) (Temporal)    Ht '6\' 1"'  (1.854 m)    Wt 185 lb (83.9 kg)    BMI 24.41 kg/m        Assessment & Plan:  Warren Miller comes in today with chief complaint of Flank Pain (Right side been going for a while. ) and Medical Management of Chronic Issues (Wants hydroxzine )   Diagnosis and orders addressed:  1. Right lower quadrant abdominal pain  - Urinalysis, Complete - CMP14+EGFR - CBC with Differential/Platelet - DG Abd 1 View - Ambulatory referral to Gastroenterology  2. GAD (generalized anxiety disorder) Start Vistaril 25 mg TID prn Stress management  - CMP14+EGFR - CBC with  Differential/Platelet - hydrOXYzine (VISTARIL) 25 MG capsule; Take 1 capsule (25 mg total) by mouth every 8 (eight) hours as needed.  Dispense: 90 capsule; Refill: 2  3. Colon cancer screening Referral to GI - CMP14+EGFR - CBC with Differential/Platelet - Ambulatory referral to Gastroenterology  4. Constipation, unspecified constipation type Force fluids Increase fiber  5. Chronic obstructive pulmonary disease, unspecified COPD type (HCC) Albuterol as needed   6. COPD exacerbation (HCC) - albuterol (VENTOLIN HFA) 108 (90 Base) MCG/ACT inhaler; Inhale 2 puffs into the lungs every 6 (six) hours as needed for wheezing or shortness of breath.  Dispense: 3 each; Refill: 4    Labs pending Health Maintenance reviewed Diet and exercise encouraged  Follow up plan: 1 month  Evelina Dun, FNP

## 2021-11-21 LAB — CBC WITH DIFFERENTIAL/PLATELET
Basophils Absolute: 0 10*3/uL (ref 0.0–0.2)
Basos: 1 %
EOS (ABSOLUTE): 0.2 10*3/uL (ref 0.0–0.4)
Eos: 3 %
Hematocrit: 48.9 % (ref 37.5–51.0)
Hemoglobin: 16.7 g/dL (ref 13.0–17.7)
Immature Grans (Abs): 0 10*3/uL (ref 0.0–0.1)
Immature Granulocytes: 0 %
Lymphocytes Absolute: 1.1 10*3/uL (ref 0.7–3.1)
Lymphs: 21 %
MCH: 30.2 pg (ref 26.6–33.0)
MCHC: 34.2 g/dL (ref 31.5–35.7)
MCV: 88 fL (ref 79–97)
Monocytes Absolute: 0.7 10*3/uL (ref 0.1–0.9)
Monocytes: 13 %
Neutrophils Absolute: 3.1 10*3/uL (ref 1.4–7.0)
Neutrophils: 62 %
Platelets: 204 10*3/uL (ref 150–450)
RBC: 5.53 x10E6/uL (ref 4.14–5.80)
RDW: 12.2 % (ref 11.6–15.4)
WBC: 5 10*3/uL (ref 3.4–10.8)

## 2021-11-21 LAB — CMP14+EGFR
ALT: 18 IU/L (ref 0–44)
AST: 21 IU/L (ref 0–40)
Albumin/Globulin Ratio: 1.5 (ref 1.2–2.2)
Albumin: 4.2 g/dL (ref 3.8–4.9)
Alkaline Phosphatase: 104 IU/L (ref 44–121)
BUN/Creatinine Ratio: 14 (ref 9–20)
BUN: 12 mg/dL (ref 6–24)
Bilirubin Total: 0.5 mg/dL (ref 0.0–1.2)
CO2: 27 mmol/L (ref 20–29)
Calcium: 9.5 mg/dL (ref 8.7–10.2)
Chloride: 103 mmol/L (ref 96–106)
Creatinine, Ser: 0.87 mg/dL (ref 0.76–1.27)
Globulin, Total: 2.8 g/dL (ref 1.5–4.5)
Glucose: 90 mg/dL (ref 70–99)
Potassium: 4.1 mmol/L (ref 3.5–5.2)
Sodium: 143 mmol/L (ref 134–144)
Total Protein: 7 g/dL (ref 6.0–8.5)
eGFR: 103 mL/min/{1.73_m2} (ref >59)

## 2021-11-23 ENCOUNTER — Other Ambulatory Visit: Payer: Self-pay | Admitting: Family

## 2021-11-23 MED ORDER — LINACLOTIDE 72 MCG PO CAPS: 72.0000 ug | ORAL_CAPSULE | Freq: Every day | ORAL | 1 refills | Status: AC

## 2021-11-28 ENCOUNTER — Other Ambulatory Visit: Payer: Self-pay | Admitting: Family

## 2021-11-28 ENCOUNTER — Ambulatory Visit: Payer: Medicare Other | Admitting: Family Medicine

## 2021-11-28 DIAGNOSIS — R109 Unspecified abdominal pain: Secondary | ICD-10-CM

## 2021-11-29 ENCOUNTER — Encounter (INDEPENDENT_AMBULATORY_CARE_PROVIDER_SITE_OTHER): Payer: Self-pay | Admitting: *Deleted

## 2021-12-05 ENCOUNTER — Other Ambulatory Visit: Payer: Medicare Other

## 2021-12-05 DIAGNOSIS — R109 Unspecified abdominal pain: Secondary | ICD-10-CM | POA: Diagnosis not present

## 2021-12-06 LAB — H PYLORI, IGM, IGG, IGA AB
H pylori, IgM Abs: <9 units (ref 0.0–8.9)
H. pylori, IgA Abs: 19 units — ABNORMAL HIGH (ref 0.0–8.9)
H. pylori, IgG AbS: 0.44 Index Value (ref 0.00–0.79)

## 2021-12-06 LAB — RPR: RPR Ser Ql: NONREACTIVE

## 2021-12-07 ENCOUNTER — Other Ambulatory Visit: Payer: Self-pay | Admitting: Family

## 2021-12-07 DIAGNOSIS — B9681 Helicobacter pylori [H. pylori] as the cause of diseases classified elsewhere: Secondary | ICD-10-CM

## 2021-12-07 MED ORDER — METRONIDAZOLE 500 MG PO TABS: 500.0000 mg | ORAL_TABLET | Freq: Three times a day (TID) | ORAL | 0 refills | Status: AC

## 2021-12-07 MED ORDER — CLARITHROMYCIN 500 MG PO TABS: 500.0000 mg | ORAL_TABLET | Freq: Two times a day (BID) | ORAL | 0 refills | Status: DC

## 2021-12-07 MED ORDER — OMEPRAZOLE 20 MG PO CPDR: 20.0000 mg | DELAYED_RELEASE_CAPSULE | Freq: Two times a day (BID) | ORAL | 1 refills | Status: AC

## 2021-12-08 ENCOUNTER — Telehealth: Payer: Self-pay | Admitting: Family

## 2021-12-11 NOTE — Telephone Encounter (Signed)
What kind of breathing issues?

## 2021-12-11 NOTE — Telephone Encounter (Signed)
Pt states his anxiety was worse when he took the biaxin which made it hard to breathe when he was having elevated anxiety and he wants amoxicillin sent in instead.

## 2021-12-14 ENCOUNTER — Telehealth: Payer: Self-pay | Admitting: Family

## 2021-12-14 MED ORDER — AMOXICILLIN 500 MG PO CAPS: 1000.0000 mg | ORAL_CAPSULE | Freq: Two times a day (BID) | ORAL | 0 refills | Status: AC

## 2021-12-14 NOTE — Telephone Encounter (Signed)
LMTCB

## 2021-12-14 NOTE — Telephone Encounter (Signed)
Amoxicillin Prescription sent to pharmacy.

## 2021-12-14 NOTE — Telephone Encounter (Signed)
How is patient doing?

## 2021-12-14 NOTE — Telephone Encounter (Signed)
Patient aware.

## 2021-12-14 NOTE — Telephone Encounter (Signed)
Patient calling back about wanting amoxicillin called in. Please call back.

## 2021-12-14 NOTE — Telephone Encounter (Signed)
Patient called back and states he is the same and no better wants you to call in the amox

## 2021-12-22 ENCOUNTER — Ambulatory Visit: Payer: Medicare Other | Admitting: Family

## 2022-01-04 ENCOUNTER — Ambulatory Visit: Payer: Medicare Other | Admitting: Family

## 2022-01-30 ENCOUNTER — Encounter (INDEPENDENT_AMBULATORY_CARE_PROVIDER_SITE_OTHER): Payer: Self-pay

## 2022-01-30 ENCOUNTER — Telehealth (INDEPENDENT_AMBULATORY_CARE_PROVIDER_SITE_OTHER): Payer: Self-pay

## 2022-01-30 ENCOUNTER — Other Ambulatory Visit (INDEPENDENT_AMBULATORY_CARE_PROVIDER_SITE_OTHER): Payer: Self-pay

## 2022-01-30 ENCOUNTER — Ambulatory Visit (INDEPENDENT_AMBULATORY_CARE_PROVIDER_SITE_OTHER): Payer: Medicare Other | Admitting: Gastroenterology

## 2022-01-30 ENCOUNTER — Encounter (INDEPENDENT_AMBULATORY_CARE_PROVIDER_SITE_OTHER): Payer: Self-pay | Admitting: Gastroenterology

## 2022-01-30 VITALS — BP 125/80 | HR 87 | Temp 98.5°F | Ht 73.0 in | Wt 176.7 lb

## 2022-01-30 DIAGNOSIS — Z86010 Personal history of colonic polyps: Secondary | ICD-10-CM

## 2022-01-30 DIAGNOSIS — R1011 Right upper quadrant pain: Secondary | ICD-10-CM

## 2022-01-30 DIAGNOSIS — R198 Other specified symptoms and signs involving the digestive system and abdomen: Secondary | ICD-10-CM

## 2022-01-30 DIAGNOSIS — R1031 Right lower quadrant pain: Secondary | ICD-10-CM | POA: Diagnosis not present

## 2022-01-30 DIAGNOSIS — K5900 Constipation, unspecified: Secondary | ICD-10-CM

## 2022-01-30 DIAGNOSIS — G8929 Other chronic pain: Secondary | ICD-10-CM

## 2022-01-30 DIAGNOSIS — R1013 Epigastric pain: Secondary | ICD-10-CM

## 2022-01-30 DIAGNOSIS — Z8719 Personal history of other diseases of the digestive system: Secondary | ICD-10-CM

## 2022-01-30 DIAGNOSIS — Z1211 Encounter for screening for malignant neoplasm of colon: Secondary | ICD-10-CM

## 2022-01-30 MED ORDER — PEG 3350-KCL-NA BICARB-NACL 420 G PO SOLR: 4000.0000 mL | ORAL | 0 refills | Status: DC

## 2022-01-30 NOTE — Patient Instructions (Addendum)
We will get you setup for [place] for further evaluation of your RUQ pain to rule out gallbladder issues ?We will schedule repeat colonoscopy and EGD ?Please continue to drink plenty of water and eat diet high in fiber to include fruits, veggies and whole grains ?Can use prune juice or you can restart linzess daily if you would like ?We will retest for H pylori during EGD ?Please let me know if you have new or worsening symptoms ?

## 2022-01-30 NOTE — H&P (View-Only) (Signed)
? ?Referring Provider: Sharion Balloon, FNP ?Primary Care Physician:  Sharion Balloon, FNP ?Primary GI Physician: new ? ?Chief Complaint  ?Patient presents with  ? Abdominal Pain  ?  Patient here today with complaints of abdominal pain. Patient says he has upper abdominal pain and right lower abdominal pain. He has bouts of constipation. He was give Linzess, but not taking it. He has a history of H Pylori.  ? ?HPI:   ?Warren Miller is a 51 y.o. male with past medical history of anxiety, chronic abdominal pain, COPD, GERD, H pylori, hiatal hernia. ? ?Patient presenting today as new patient for abdominal pain, constipation and in need of repeat colonoscopy  ? ?Saw PCP for RLQ pain in January, had Abd xray done that showed large stool burden without evidence of bowel obstruction, patient given linzess 22mg to take and advised to push fluids. CBC and CMP as well as UA done in January WNL. Had H pylori testing in Feb 2023 with positive IgA of 19, treated with PPI '20mg'$  BID, clarithromycin '500mg'$  BID x14 days, Flagyl '500mg'$  TID x14 days. ? ?Patient states that he has been having some upper and lower R abdominal pain.  He saw PCP and had xray that he was told showed stool. He requested that they check him for H pylori as he has had this in the past. Testing was positive and he completed medication as above approximately 2 weeks ago. States that he took the linzess for a few days but then once h pylori was positive he figured he was constipated because of that so stopped the linzess. He states that he is having a BM maybe every other day, does note that he has to strain often to go. He has tried to increase his fiber intake over the past few weeks which has helped some. Does drink maybe four 16 oz water bottles per day. Notes that pain is in epigastric area at times, sometimes RUQ area and sometimes RLQ.  States that RUQ pain is constant, unsure if eating makes it better or worse, denies any radiation of pain. Denies  rectal bleeding or melena. Denies any nausea or vomiting. States that abdominal pain did not really improve after completing H pylori treatment and he wonders if he needed a longer course as he had 1 month worth of medication for previous infection.  Reports appetite is good and he has had no weight loss. Denies dysphagia or odynophagia. Reports that he has intermittent severe abdominal pain that will hit him all of a sudden twinge of pain that "runs throughout his body" he is unsure of where the pain is originating from. Was told previously it was related to anxiety.  ? ?Reports hx of GERD but this is well controlled on omeprazole '20mg'$  BID.  ? ?NSAID use: ibuprofen a few times per month ?Social hx: no etoh, chews tobacco ?Fam hx: mother had alcoholic cirrhosis  ? ?Last Colonoscopy:07/11/10 colon polyps ?Last Endoscopy:07/11/10 hiatal hernia ? ?Recommendations:  ? ? ?Past Medical History:  ?Diagnosis Date  ? Anxiety disorder   ? Chronic abdominal pain   ? [place] disease) [place]   ? Former heavy cigarette smoker (20-39 per day)   ? GERD (gastroesophageal reflux disease)   ? H. pylori infection   ? Hiatal hernia   ? ? ?Past Surgical History:  ?Procedure Laterality Date  ? HERNIA REPAIR    ? x 2, inguinal, bilateral  ? ? ?Current Outpatient Medications  ?Medication Sig  Dispense Refill  ? albuterol (VENTOLIN HFA) 108 (90 Base) MCG/ACT inhaler Inhale 2 puffs into the lungs every 6 (six) hours as needed for wheezing or shortness of breath. 3 each 4  ? hydrOXYzine (ATARAX) 25 MG tablet Take 25 mg by mouth every 8 (eight) hours as needed.    ? ibuprofen (ADVIL) 200 MG tablet Take 200 mg by mouth every 6 (six) hours as needed.    ? omeprazole (PRILOSEC) 20 MG capsule Take 1 capsule (20 mg total) by mouth 2 (two) times daily before a meal. 60 capsule 1  ? OVER THE COUNTER MEDICATION Vit D3 and Vit C one daily    ? Calcium Carbonate Antacid (ANTACID EXTRA STRENGTH PO) Take 1-2 tablets by mouth daily.     ? linaclotide (LINZESS) 72 MCG capsule Take 1 capsule (72 mcg total) by mouth daily before breakfast. (Patient not taking: Reported on 01/30/2022) 90 capsule 1  ? ?No current facility-administered medications for this visit.  ? ? ?Allergies as of 01/30/2022 - Review Complete 01/30/2022  ?Allergen Reaction Noted  ? Other Shortness Of Breath 10/11/2012  ? Tetracyclines & related  03/10/2014  ? ? ?Family History  ?Problem Relation Age of Onset  ? Liver disease Mother   ? Alcoholism Mother   ? ? ?Social History  ? ?Socioeconomic History  ? Marital status: Legally Separated  ?  Spouse name: Kenney Houseman  ? Number of children: 2  ? Years of education: 39  ? Highest education level: Not on file  ?Occupational History  ? Occupation: disabled  ?Tobacco Use  ? Smoking status: Former  ?  Packs/day: 0.25  ?  Types: Cigarettes  ?  Quit date: 09/18/2004  ?  Years since quitting: 17.3  ? Smokeless tobacco: Current  ?  Types: Chew  ? Tobacco comments:  ?  2 or more cans smokeless per week, 09/07/15 smoking 4-5 cigs daily - quit  ?Vaping Use  ? Vaping Use: Never used  ?Substance and Sexual Activity  ? Alcohol use: No  ?  Alcohol/week: 0.0 standard drinks  ? Drug use: No  ? Sexual activity: Yes  ?  Birth control/protection: None  ?Other Topics Concern  ? Not on file  ?Social History Narrative  ? Lives alone , 2 children  ? Sometimes his son stays with him  ? Caffeine use- none to rare  ? Chews tobacco  ? ?Social Determinants of Health  ? ?Financial Resource Strain: Low Risk   ? Difficulty of Paying Living Expenses: Not very hard  ?Food Insecurity: No Food Insecurity  ? Worried About Charity fundraiser in the Last Year: Never true  ? Ran Out of Food in the Last Year: Never true  ?Transportation Needs: No Transportation Needs  ? Lack of Transportation (Medical): No  ? Lack of Transportation (Non-Medical): No  ?Physical Activity: Inactive  ? Days of Exercise per Week: 0 days  ? Minutes of Exercise per Session: 0 min  ?Stress: Stress Concern  Present  ? Feeling of Stress : Rather much  ?Social Connections: Socially Isolated  ? Frequency of Communication with Friends and Family: More than three times a week  ? Frequency of Social Gatherings with Friends and Family: Twice a week  ? Attends Religious Services: Never  ? Active Member of Clubs or Organizations: No  ? Attends Archivist Meetings: Never  ? Marital Status: Divorced  ? ?Review of systems ?General: negative for malaise, night sweats, fever, chills, weight loss ?Neck: Negative for  lumps, goiter, pain and significant neck swelling ?Resp: Negative for cough, wheezing, dyspnea at rest ?CV: Negative for chest pain, leg swelling, palpitations, orthopnea ?GI: denies melena, hematochezia, nausea, vomiting, diarrhea, dysphagia, odyonophagia, early satiety or unintentional weight loss. +constipation +epigastric pain +RLQ pain +RUQ pain  ?MSK: Negative for joint pain or swelling, back pain, and muscle pain. ?Derm: Negative for itching or rash ?Psych: Denies depression, anxiety, memory loss, confusion. No homicidal or suicidal ideation.  ?Heme: Negative for prolonged bleeding, bruising easily, and swollen nodes. ?Endocrine: Negative for cold or heat intolerance, polyuria, polydipsia and goiter. ?Neuro: negative for tremor, gait imbalance, syncope and seizures. ?The remainder of the review of systems is noncontributory. ? ?Physical Exam: ?BP 125/80 (BP Location: Left Arm, Patient Position: Sitting, Cuff Size: Small)   Pulse 87   Temp 98.5 ?F (36.9 ?C) (Oral)   Ht '6\' 1"'$  (1.854 m)   Wt 176 lb 11.2 oz (80.2 kg)   BMI 23.31 kg/m?  ?General:   Alert and oriented. No distress noted. Pleasant and cooperative.  ?Head:  Normocephalic and atraumatic. ?Eyes:  Conjuctiva clear without scleral icterus. ?Mouth:  Oral mucosa pink and moist. Good dentition. No lesions. ?Heart: Normal rate and rhythm, s1 and s2 heart sounds present.  ?Lungs: Clear lung sounds in all lobes. Respirations equal and  unlabored. ?Abdomen:  +BS, soft.  TTP of RLQ, +murphys sign, TTP of epigastric region.  No HSM or masses noted. ?Derm: No palmar erythema or jaundice ?Msk:  Symmetrical without gross deformities. Normal posture. ?Extremities:

## 2022-01-30 NOTE — Progress Notes (Signed)
? ?Referring Provider: Sharion Balloon, FNP ?Primary Care Physician:  Sharion Balloon, FNP ?Primary GI Physician: new ? ?Chief Complaint  ?Patient presents with  ? Abdominal Pain  ?  Patient here today with complaints of abdominal pain. Patient says he has upper abdominal pain and right lower abdominal pain. He has bouts of constipation. He was give Linzess, but not taking it. He has a history of H Pylori.  ? ?HPI:   ?Warren Miller is a 51 y.o. male with past medical history of anxiety, chronic abdominal pain, COPD, GERD, H pylori, hiatal hernia. ? ?Patient presenting today as new patient for abdominal pain, constipation and in need of repeat colonoscopy  ? ?Saw PCP for RLQ pain in January, had Abd xray done that showed large stool burden without evidence of bowel obstruction, patient given linzess 45mg to take and advised to push fluids. CBC and CMP as well as UA done in January WNL. Had H pylori testing in Feb 2023 with positive IgA of 19, treated with PPI '20mg'$  BID, clarithromycin '500mg'$  BID x14 days, Flagyl '500mg'$  TID x14 days. ? ?Patient states that he has been having some upper and lower R abdominal pain.  He saw PCP and had xray that he was told showed stool. He requested that they check him for H pylori as he has had this in the past. Testing was positive and he completed medication as above approximately 2 weeks ago. States that he took the linzess for a few days but then once h pylori was positive he figured he was constipated because of that so stopped the linzess. He states that he is having a BM maybe every other day, does note that he has to strain often to go. He has tried to increase his fiber intake over the past few weeks which has helped some. Does drink maybe four 16 oz water bottles per day. Notes that pain is in epigastric area at times, sometimes RUQ area and sometimes RLQ.  States that RUQ pain is constant, unsure if eating makes it better or worse, denies any radiation of pain. Denies  rectal bleeding or melena. Denies any nausea or vomiting. States that abdominal pain did not really improve after completing H pylori treatment and he wonders if he needed a longer course as he had 1 month worth of medication for previous infection.  Reports appetite is good and he has had no weight loss. Denies dysphagia or odynophagia. Reports that he has intermittent severe abdominal pain that will hit him all of a sudden twinge of pain that "runs throughout his body" he is unsure of where the pain is originating from. Was told previously it was related to anxiety.  ? ?Reports hx of GERD but this is well controlled on omeprazole '20mg'$  BID.  ? ?NSAID use: ibuprofen a few times per month ?Social hx: no etoh, chews tobacco ?Fam hx: mother had alcoholic cirrhosis  ? ?Last Colonoscopy:07/11/10 colon polyps ?Last Endoscopy:07/11/10 hiatal hernia ? ?Recommendations:  ? ? ?Past Medical History:  ?Diagnosis Date  ? Anxiety disorder   ? Chronic abdominal pain   ? COPD (chronic obstructive pulmonary disease) ([place]   ? Former heavy cigarette smoker (20-39 per day)   ? GERD (gastroesophageal reflux disease)   ? H. pylori infection   ? Hiatal hernia   ? ? ?Past Surgical History:  ?Procedure Laterality Date  ? HERNIA REPAIR    ? x 2, inguinal, bilateral  ? ? ?Current Outpatient Medications  ?Medication Sig  Dispense Refill  ? albuterol (VENTOLIN HFA) 108 (90 Base) MCG/ACT inhaler Inhale 2 puffs into the lungs every 6 (six) hours as needed for wheezing or shortness of breath. 3 each 4  ? hydrOXYzine (ATARAX) 25 MG tablet Take 25 mg by mouth every 8 (eight) hours as needed.    ? ibuprofen (ADVIL) 200 MG tablet Take 200 mg by mouth every 6 (six) hours as needed.    ? omeprazole (PRILOSEC) 20 MG capsule Take 1 capsule (20 mg total) by mouth 2 (two) times daily before a meal. 60 capsule 1  ? OVER THE COUNTER MEDICATION Vit D3 and Vit C one daily    ? Calcium Carbonate Antacid (ANTACID EXTRA STRENGTH PO) Take 1-2 tablets by mouth daily.     ? linaclotide (LINZESS) 72 MCG capsule Take 1 capsule (72 mcg total) by mouth daily before breakfast. (Patient not taking: Reported on 01/30/2022) 90 capsule 1  ? ?No current facility-administered medications for this visit.  ? ? ?Allergies as of 01/30/2022 - Review Complete 01/30/2022  ?Allergen Reaction Noted  ? Other Shortness Of Breath 10/11/2012  ? Tetracyclines & related  03/10/2014  ? ? ?Family History  ?Problem Relation Age of Onset  ? Liver disease Mother   ? Alcoholism Mother   ? ? ?Social History  ? ?Socioeconomic History  ? Marital status: Legally Separated  ?  Spouse name: Kenney Houseman  ? Number of children: 2  ? Years of education: 42  ? Highest education level: Not on file  ?Occupational History  ? Occupation: disabled  ?Tobacco Use  ? Smoking status: Former  ?  Packs/day: 0.25  ?  Types: Cigarettes  ?  Quit date: 09/18/2004  ?  Years since quitting: 17.3  ? Smokeless tobacco: Current  ?  Types: Chew  ? Tobacco comments:  ?  2 or more cans smokeless per week, 09/07/15 smoking 4-5 cigs daily - quit  ?Vaping Use  ? Vaping Use: Never used  ?Substance and Sexual Activity  ? Alcohol use: No  ?  Alcohol/week: 0.0 standard drinks  ? Drug use: No  ? Sexual activity: Yes  ?  Birth control/protection: None  ?Other Topics Concern  ? Not on file  ?Social History Narrative  ? Lives alone , 2 children  ? Sometimes his son stays with him  ? Caffeine use- none to rare  ? Chews tobacco  ? ?Social Determinants of Health  ? ?Financial Resource Strain: Low Risk   ? Difficulty of Paying Living Expenses: Not very hard  ?Food Insecurity: No Food Insecurity  ? Worried About Charity fundraiser in the Last Year: Never true  ? Ran Out of Food in the Last Year: Never true  ?Transportation Needs: No Transportation Needs  ? Lack of Transportation (Medical): No  ? Lack of Transportation (Non-Medical): No  ?Physical Activity: Inactive  ? Days of Exercise per Week: 0 days  ? Minutes of Exercise per Session: 0 min  ?Stress: Stress Concern  Present  ? Feeling of Stress : Rather much  ?Social Connections: Socially Isolated  ? Frequency of Communication with Friends and Family: More than three times a week  ? Frequency of Social Gatherings with Friends and Family: Twice a week  ? Attends Religious Services: Never  ? Active Member of Clubs or Organizations: No  ? Attends Archivist Meetings: Never  ? Marital Status: Divorced  ? ?Review of systems ?General: negative for malaise, night sweats, fever, chills, weight loss ?Neck: Negative for  lumps, goiter, pain and significant neck swelling ?Resp: Negative for cough, wheezing, dyspnea at rest ?CV: Negative for chest pain, leg swelling, palpitations, orthopnea ?GI: denies melena, hematochezia, nausea, vomiting, diarrhea, dysphagia, odyonophagia, early satiety or unintentional weight loss. +constipation +epigastric pain +RLQ pain +RUQ pain  ?MSK: Negative for joint pain or swelling, back pain, and muscle pain. ?Derm: Negative for itching or rash ?Psych: Denies depression, anxiety, memory loss, confusion. No homicidal or suicidal ideation.  ?Heme: Negative for prolonged bleeding, bruising easily, and swollen nodes. ?Endocrine: Negative for cold or heat intolerance, polyuria, polydipsia and goiter. ?Neuro: negative for tremor, gait imbalance, syncope and seizures. ?The remainder of the review of systems is noncontributory. ? ?Physical Exam: ?BP 125/80 (BP Location: Left Arm, Patient Position: Sitting, Cuff Size: Small)   Pulse 87   Temp 98.5 ?F (36.9 ?C) (Oral)   Ht '6\' 1"'$  (1.854 m)   Wt 176 lb 11.2 oz (80.2 kg)   BMI 23.31 kg/m?  ?General:   Alert and oriented. No distress noted. Pleasant and cooperative.  ?Head:  Normocephalic and atraumatic. ?Eyes:  Conjuctiva clear without scleral icterus. ?Mouth:  Oral mucosa pink and moist. Good dentition. No lesions. ?Heart: Normal rate and rhythm, s1 and s2 heart sounds present.  ?Lungs: Clear lung sounds in all lobes. Respirations equal and  unlabored. ?Abdomen:  +BS, soft.  TTP of RLQ, +murphys sign, TTP of epigastric region.  No HSM or masses noted. ?Derm: No palmar erythema or jaundice ?Msk:  Symmetrical without gross deformities. Normal posture. ?Extremities:

## 2022-01-30 NOTE — Telephone Encounter (Signed)
Romonda Parker Ann Sarahlynn Cisnero, CMA  ?

## 2022-01-31 DIAGNOSIS — Z1211 Encounter for screening for malignant neoplasm of colon: Secondary | ICD-10-CM | POA: Insufficient documentation

## 2022-02-16 ENCOUNTER — Encounter: Payer: Medicare Other | Admitting: Physical Medicine and Rehabilitation

## 2022-02-27 ENCOUNTER — Ambulatory Visit (HOSPITAL_COMMUNITY)
Admission: RE | Admit: 2022-02-27 | Discharge: 2022-02-27 | Disposition: A | Discharge status: Home / Self Care | Payer: Medicare Other | Source: Ambulatory Visit | Attending: Gastroenterology | Admitting: Gastroenterology

## 2022-02-27 DIAGNOSIS — R1011 Right upper quadrant pain: Secondary | ICD-10-CM | POA: Diagnosis not present

## 2022-02-27 DIAGNOSIS — R198 Other specified symptoms and signs involving the digestive system and abdomen: Secondary | ICD-10-CM | POA: Diagnosis not present

## 2022-02-28 ENCOUNTER — Encounter (HOSPITAL_COMMUNITY)
Admission: RE | Disposition: A | Discharge status: Home / Self Care | Payer: Self-pay | Source: Home / Self Care | Attending: Gastroenterology

## 2022-02-28 ENCOUNTER — Ambulatory Visit (HOSPITAL_COMMUNITY): Payer: Medicare Other | Admitting: Anesthesiology

## 2022-02-28 ENCOUNTER — Encounter (HOSPITAL_COMMUNITY): Payer: Self-pay | Admitting: Gastroenterology

## 2022-02-28 ENCOUNTER — Other Ambulatory Visit: Payer: Self-pay

## 2022-02-28 ENCOUNTER — Ambulatory Visit (HOSPITAL_BASED_OUTPATIENT_CLINIC_OR_DEPARTMENT_OTHER): Payer: Medicare Other | Admitting: Anesthesiology

## 2022-02-28 ENCOUNTER — Encounter (INDEPENDENT_AMBULATORY_CARE_PROVIDER_SITE_OTHER): Payer: Self-pay | Admitting: *Deleted

## 2022-02-28 ENCOUNTER — Ambulatory Visit (HOSPITAL_COMMUNITY)
Admission: RE | Admit: 2022-02-28 | Discharge: 2022-02-28 | Disposition: A | Discharge status: Home / Self Care | Payer: Medicare Other | Attending: Gastroenterology | Admitting: Gastroenterology

## 2022-02-28 DIAGNOSIS — K319 Disease of stomach and duodenum, unspecified: Secondary | ICD-10-CM | POA: Diagnosis not present

## 2022-02-28 DIAGNOSIS — D122 Benign neoplasm of ascending colon: Secondary | ICD-10-CM | POA: Insufficient documentation

## 2022-02-28 DIAGNOSIS — J449 Chronic obstructive pulmonary disease, unspecified: Secondary | ICD-10-CM | POA: Diagnosis not present

## 2022-02-28 DIAGNOSIS — K5730 Diverticulosis of large intestine without perforation or abscess without bleeding: Secondary | ICD-10-CM

## 2022-02-28 DIAGNOSIS — K2950 Unspecified chronic gastritis without bleeding: Secondary | ICD-10-CM | POA: Diagnosis not present

## 2022-02-28 DIAGNOSIS — R1011 Right upper quadrant pain: Secondary | ICD-10-CM | POA: Diagnosis not present

## 2022-02-28 DIAGNOSIS — R1031 Right lower quadrant pain: Secondary | ICD-10-CM | POA: Insufficient documentation

## 2022-02-28 DIAGNOSIS — Z1211 Encounter for screening for malignant neoplasm of colon: Secondary | ICD-10-CM | POA: Diagnosis not present

## 2022-02-28 DIAGNOSIS — R109 Unspecified abdominal pain: Secondary | ICD-10-CM | POA: Diagnosis not present

## 2022-02-28 DIAGNOSIS — K635 Polyp of colon: Secondary | ICD-10-CM | POA: Insufficient documentation

## 2022-02-28 DIAGNOSIS — Z09 Encounter for follow-up examination after completed treatment for conditions other than malignant neoplasm: Secondary | ICD-10-CM | POA: Diagnosis not present

## 2022-02-28 DIAGNOSIS — K5900 Constipation, unspecified: Secondary | ICD-10-CM | POA: Insufficient documentation

## 2022-02-28 DIAGNOSIS — Z87891 Personal history of nicotine dependence: Secondary | ICD-10-CM | POA: Diagnosis not present

## 2022-02-28 DIAGNOSIS — G8929 Other chronic pain: Secondary | ICD-10-CM | POA: Insufficient documentation

## 2022-02-28 DIAGNOSIS — Z8619 Personal history of other infectious and parasitic diseases: Secondary | ICD-10-CM | POA: Diagnosis not present

## 2022-02-28 DIAGNOSIS — K449 Diaphragmatic hernia without obstruction or gangrene: Secondary | ICD-10-CM | POA: Diagnosis not present

## 2022-02-28 DIAGNOSIS — R1013 Epigastric pain: Secondary | ICD-10-CM | POA: Insufficient documentation

## 2022-02-28 DIAGNOSIS — K31A19 Gastric intestinal metaplasia without dysplasia, unspecified site: Secondary | ICD-10-CM | POA: Diagnosis not present

## 2022-02-28 DIAGNOSIS — K219 Gastro-esophageal reflux disease without esophagitis: Secondary | ICD-10-CM | POA: Diagnosis not present

## 2022-02-28 DIAGNOSIS — Z8719 Personal history of other diseases of the digestive system: Secondary | ICD-10-CM

## 2022-02-28 DIAGNOSIS — Z86010 Personal history of colonic polyps: Secondary | ICD-10-CM | POA: Insufficient documentation

## 2022-02-28 HISTORY — PX: COLONOSCOPY WITH PROPOFOL: SHX5780

## 2022-02-28 HISTORY — PX: SUBMUCOSAL LIFTING INJECTION: SHX6855

## 2022-02-28 HISTORY — PX: ESOPHAGOGASTRODUODENOSCOPY (EGD) WITH PROPOFOL: SHX5813

## 2022-02-28 HISTORY — PX: POLYPECTOMY: SHX5525

## 2022-02-28 HISTORY — PX: BIOPSY: SHX5522

## 2022-02-28 LAB — HM COLONOSCOPY

## 2022-02-28 SURGERY — COLONOSCOPY WITH PROPOFOL
Anesthesia: General

## 2022-02-28 MED ORDER — LIDOCAINE HCL URETHRAL/MUCOSAL 2 % EX GEL
CUTANEOUS | Status: DC | PRN
  Administered 2022-02-28: 1

## 2022-02-28 MED ORDER — EPHEDRINE SULFATE-NACL 50-0.9 MG/10ML-% IV SOSY
PREFILLED_SYRINGE | INTRAVENOUS | Status: DC | PRN
  Administered 2022-02-28 (×3): 5 mg via INTRAVENOUS

## 2022-02-28 MED ORDER — LIDOCAINE VISCOUS HCL 2 % MT SOLN
OROMUCOSAL | Status: AC
  Filled 2022-02-28: qty 15

## 2022-02-28 MED ORDER — PHENYLEPHRINE HCL (PRESSORS) 10 MG/ML IV SOLN
INTRAVENOUS | Status: DC | PRN
  Administered 2022-02-28 (×3): 80 ug via INTRAVENOUS
  Administered 2022-02-28 (×2): 160 ug via INTRAVENOUS
  Administered 2022-02-28: 80 ug via INTRAVENOUS

## 2022-02-28 MED ORDER — EPHEDRINE 5 MG/ML INJ
INTRAVENOUS | Status: AC
  Filled 2022-02-28: qty 5

## 2022-02-28 MED ORDER — PROPOFOL 10 MG/ML IV BOLUS
INTRAVENOUS | Status: DC | PRN
  Administered 2022-02-28: 80 mg via INTRAVENOUS

## 2022-02-28 MED ORDER — PROPOFOL 500 MG/50ML IV EMUL
INTRAVENOUS | Status: DC | PRN
Start: 2022-02-28 — End: 2022-02-28
  Administered 2022-02-28: 180 ug/kg/min via INTRAVENOUS

## 2022-02-28 MED ORDER — LACTATED RINGERS IV SOLN: INTRAVENOUS | Status: DC

## 2022-02-28 NOTE — Anesthesia Preprocedure Evaluation (Signed)
Anesthesia Evaluation  ?Patient identified by MRN, date of birth, ID band ?Patient awake ? ? ? ?Reviewed: ?Allergy & Precautions, H&P , NPO status , Patient's Chart, lab work & pertinent test results, reviewed documented beta blocker date and time  ? ?Airway ?Mallampati: II ? ?TM Distance: >3 FB ?Neck ROM: full ? ? ? Dental ?no notable dental hx. ? ?  ?Pulmonary ?COPD, former smoker,  ?  ?Pulmonary exam normal ?breath sounds clear to auscultation ? ? ? ? ? ? Cardiovascular ?Exercise Tolerance: Good ?negative cardio ROS ? ? ?Rhythm:regular Rate:Normal ? ? ?  ?Neuro/Psych ?PSYCHIATRIC DISORDERS Anxiety  Neuromuscular disease   ? GI/Hepatic ?Neg liver ROS, hiatal hernia, GERD  Medicated,  ?Endo/Other  ?negative endocrine ROS ? Renal/GU ?negative Renal ROS  ?negative genitourinary ?  ?Musculoskeletal ? ? Abdominal ?  ?Peds ? Hematology ?negative hematology ROS ?(+)   ?Anesthesia Other Findings ? ? Reproductive/Obstetrics ?negative OB ROS ? ?  ? ? ? ? ? ? ? ? ? ? ? ? ? ?  ?  ? ? ? ? ? ? ? ? ?Anesthesia Physical ?Anesthesia Plan ? ?ASA: 2 ? ?Anesthesia Plan: General  ? ?Post-op Pain Management:   ? ?Induction:  ? ?PONV Risk Score and Plan: Propofol infusion ? ?Airway Management Planned:  ? ?Additional Equipment:  ? ?Intra-op Plan:  ? ?Post-operative Plan:  ? ?Informed Consent: I have reviewed the patients History and Physical, chart, labs and discussed the procedure including the risks, benefits and alternatives for the proposed anesthesia with the patient or authorized representative who has indicated his/her understanding and acceptance.  ? ? ? ?Dental Advisory Given ? ?Plan Discussed with: CRNA ? ?Anesthesia Plan Comments:   ? ? ? ? ? ? ?Anesthesia Quick Evaluation ? ?

## 2022-02-28 NOTE — Transfer of Care (Signed)
Immediate Anesthesia Transfer of Care Note ? ?Patient: Warren Miller ? ?Procedure(s) Performed: COLONOSCOPY WITH PROPOFOL ?ESOPHAGOGASTRODUODENOSCOPY (EGD) WITH PROPOFOL ?BIOPSY ?POLYPECTOMY ?SUBMUCOSAL LIFTING INJECTION ? ?Patient Location: PACU ? ?Anesthesia Type:General ? ?Level of Consciousness: awake, alert  and oriented ? ?Airway & Oxygen Therapy: Patient Spontanous Breathing ? ?Post-op Assessment: Report given to RN, Post -op Vital signs reviewed and stable, Patient moving all extremities X 4 and Patient able to stick tongue midline ? ?Post vital signs: Reviewed ? ?Last Vitals:  ?Vitals Value Taken Time  ?BP 95/50   ?Temp 97.5   ?Pulse 85   ?Resp 14   ?SpO2 100   ? ? ?Last Pain:  ?Vitals:  ? 02/28/22 1303  ?TempSrc:   ?PainSc: 0-No pain  ?   ? ?Patients Stated Pain Goal: 7 (02/28/22 1202) ? ?Complications: No notable events documented. ?

## 2022-02-28 NOTE — Discharge Instructions (Addendum)
You are being discharged to home.  Resume your previous diet.  We are waiting for your pathology results.  Your physician has recommended a repeat colonoscopy for surveillance based on pathology results.  

## 2022-02-28 NOTE — Op Note (Signed)
[facility] ?Patient Name: Warren Miller ?Procedure Date: 02/28/2022 12:53 PM ?MRN: [id number] ?Date of Birth: Sep 15, 1971 ?Attending MD: Maylon Peppers ,  ?CSN: [id number] ?Age: 51 ?Admit Type: Outpatient ?Procedure:                Upper GI endoscopy ?Indications:              Abdominal pain ?Providers:                Maylon Peppers, Lambert Mody, Kristine L.  ?                          Risa Grill, Technician ?Referring MD:              ?Medicines:                Monitored Anesthesia Care ?Complications:            No immediate complications. ?Estimated Blood Loss:     Estimated blood loss: none. ?Procedure:                Pre-Anesthesia Assessment: ?                          - Prior to the procedure, a History and Physical  ?                          was performed, and patient medications, allergies  ?                          and sensitivities were reviewed. The patient's  ?                          tolerance of previous anesthesia was reviewed. ?                          - The risks and benefits of the procedure and the  ?                          sedation options and risks were discussed with the  ?                          patient. All questions were answered and informed  ?                          consent was obtained. ?                          - ASA Grade Assessment: II - A patient with mild  ?                          systemic disease. ?                          After obtaining informed consent, the endoscope was  ?                          passed under direct vision. Throughout the  ?  procedure, the patient's blood pressure, pulse, and  ?                          oxygen saturations were monitored continuously. The  ?                          GIF-H190 ([id number]) scope was introduced through the  ?                          mouth, and advanced to the second part of duodenum.  ?                          The upper GI endoscopy was accomplished without  ?                           difficulty. The patient tolerated the procedure  ?                          well. ?Scope In: 1:06:30 PM ?Scope Out: 1:12:29 PM ?Total Procedure Duration: 0 hours 5 minutes 59 seconds  ?Findings: ?     The Z-line was regular and was found 43 cm from the incisors. ?     The esophagus was normal. ?     The entire examined stomach was normal. Biopsies were taken with a cold  ?     forceps for Helicobacter pylori testing. ?     The examined duodenum was normal. Biopsies were taken with a cold  ?     forceps for histology. ?Impression:               - Z-line regular, 43 cm from the incisors. ?                          - Normal esophagus. ?                          - Normal stomach. Biopsied. ?                          - Normal examined duodenum. Biopsied. ?Moderate Sedation: ?     Per Anesthesia Care ?Recommendation:           - Discharge patient to home (ambulatory). ?                          - Resume previous diet. ?                          - Await pathology results. ?Procedure Code(s):        --- Professional --- ?                          [phone], Esophagogastroduodenoscopy, flexible,  ?                          transoral; with biopsy, single or multiple ?Diagnosis Code(s):        --- Professional --- ?  R10.9, Unspecified abdominal pain ?CPT copyright 2019 American Medical Association. All rights reserved. ?The codes documented in this report are preliminary and upon coder review may  ?be revised to meet current compliance requirements. ?Maylon Peppers, MD ?Maylon Peppers,  ?02/28/2022 1:57:02 PM ?This report has been signed electronically. ?Number of Addenda: 0 ?

## 2022-02-28 NOTE — Interval H&P Note (Signed)
History and Physical Interval Note: ? ?02/28/2022 ?12:17 PM ? ?Warren Miller  has presented today for surgery, with the diagnosis of Hiatal Hernia RLQ Pain Hx of polyps.  The various methods of treatment have been discussed with the patient and family. After consideration of risks, benefits and other options for treatment, the patient has consented to  Procedure(s) with comments: ?COLONOSCOPY WITH PROPOFOL (N/A) - 1105 ?ESOPHAGOGASTRODUODENOSCOPY (EGD) WITH PROPOFOL (N/A) as a surgical intervention.  The patient's history has been reviewed, patient examined, no change in status, stable for surgery.  I have reviewed the patient's chart and labs.  Questions were answered to the patient's satisfaction.   ? ? ?[name] ? ? ?

## 2022-02-28 NOTE — Op Note (Signed)
[facility] ?Patient Name: Warren Miller ?Procedure Date: 02/28/2022 1:15 PM ?MRN: [id number] ?Date of Birth: 1971/09/14 ?Attending MD: Maylon Peppers ,  ?CSN: [id number] ?Age: 51 ?Admit Type: Outpatient ?Procedure:                Colonoscopy ?Indications:              High risk colon cancer surveillance: Personal  ?                          history of colonic polyps ?Providers:                Maylon Peppers, Lambert Mody, Kristine L.  ?                          Risa Grill, Technician ?Referring MD:              ?Medicines:                Monitored Anesthesia Care ?Complications:            No immediate complications. ?Estimated Blood Loss:     Estimated blood loss: none. ?Procedure:                Pre-Anesthesia Assessment: ?                          - Prior to the procedure, a History and Physical  ?                          was performed, and patient medications, allergies  ?                          and sensitivities were reviewed. The patient's  ?                          tolerance of previous anesthesia was reviewed. ?                          - The risks and benefits of the procedure and the  ?                          sedation options and risks were discussed with the  ?                          patient. All questions were answered and informed  ?                          consent was obtained. ?                          - ASA Grade Assessment: II - A patient with mild  ?                          systemic disease. ?                          After obtaining informed consent, the colonoscope  ?  was passed under direct vision. Throughout the  ?                          procedure, the patient's blood pressure, pulse, and  ?                          oxygen saturations were monitored continuously. The  ?                          [id number]-HQ190L ([id number]) scope was introduced through  ?                          the anus and advanced to the the cecum, identified  ?                           by appendiceal orifice and ileocecal valve. The  ?                          colonoscopy was performed without difficulty. The  ?                          patient tolerated the procedure well. The quality  ?                          of the bowel preparation was excellent. ?Scope In: 1:17:10 PM ?Scope Out: 1:54:21 PM ?Scope Withdrawal Time: 0 hours 31 minutes 36 seconds  ?Total Procedure Duration: 0 hours 37 minutes 11 seconds  ?Findings: ?     The perianal and digital rectal examinations were normal. ?     A 12 mm polyp was found in the ascending colon. The polyp was flat. Area  ?     was successfully injected with 10 mL Eleview for a lift polypectomy.  ?     Imaging was performed using white light and narrow band imaging to  ?     visualize the mucosa and demarcate the polyp site after injection for  ?     EMR purposes. The polyp was removed with a hot snare. Resection and  ?     retrieval were complete. ?     A 2 mm polyp was found in the ascending colon. The polyp was sessile.  ?     The polyp was removed with a cold biopsy forceps. Resection and  ?     retrieval were complete. ?     A 6 mm polyp was found in the descending colon. The polyp was sessile.  ?     The polyp was removed with a cold snare. Resection and retrieval were  ?     complete. ?     Multiple small and large-mouthed diverticula were found in the sigmoid  ?     colon and descending colon. ?     The retroflexed view of the distal rectum and anal verge was normal and  ?     showed no anal or rectal abnormalities. ?Impression:               - One 12 mm polyp in the ascending colon, removed  ?  with a hot snare. Resected and retrieved. Injected. ?                          - One 2 mm polyp in the ascending colon, removed  ?                          with a cold biopsy forceps. Resected and retrieved. ?                          - One 6 mm polyp in the descending colon, removed  ?                          with a cold snare.  Resected and retrieved. ?                          - Diverticulosis in the sigmoid colon and in the  ?                          descending colon. ?                          - The distal rectum and anal verge are normal on  ?                          retroflexion view. ?Moderate Sedation: ?     Per Anesthesia Care ?Recommendation:           - Discharge patient to home (ambulatory). ?                          - Resume previous diet. ?                          - Await pathology results. ?                          - Repeat colonoscopy for surveillance based on  ?                          pathology results. ?Procedure Code(s):        --- Professional --- ?                          [phone], 66, Colonoscopy, flexible; with removal of  ?                          tumor(s), polyp(s), or other lesion(s) by snare  ?                          technique ?                          45381, Colonoscopy, flexible; with directed  ?                          submucosal injection(s), any substance ?  45380, 59, Colonoscopy, flexible; with biopsy,  ?                          single or multiple ?Diagnosis Code(s):        --- Professional --- ?                          Z86.010, Personal history of colonic polyps ?                          K63.5, Polyp of colon ?                          K57.30, Diverticulosis of large intestine without  ?                          perforation or abscess without bleeding ?CPT copyright 2019 American Medical Association. All rights reserved. ?The codes documented in this report are preliminary and upon coder review may  ?be revised to meet current compliance requirements. ?Maylon Peppers, MD ?Maylon Peppers,  ?02/28/2022 2:01:15 PM ?This report has been signed electronically. ?Number of Addenda: 0 ?

## 2022-03-01 NOTE — Anesthesia Postprocedure Evaluation (Signed)
Anesthesia Post Note ? ?Patient: Warren Miller ? ?Procedure(s) Performed: COLONOSCOPY WITH PROPOFOL ?ESOPHAGOGASTRODUODENOSCOPY (EGD) WITH PROPOFOL ?BIOPSY ?POLYPECTOMY ?SUBMUCOSAL LIFTING INJECTION ? ?Patient location during evaluation: Phase II ?Anesthesia Type: General ?Level of consciousness: awake ?Pain management: pain level controlled ?Vital Signs Assessment: post-procedure vital signs reviewed and stable ?Respiratory status: spontaneous breathing and respiratory function stable ?Cardiovascular status: blood pressure returned to baseline and stable ?Postop Assessment: no headache and no apparent nausea or vomiting ?Anesthetic complications: no ?Comments: Late entry ? ? ?No notable events documented. ? ? ?Last Vitals:  ?Vitals:  ? 02/28/22 1202 02/28/22 1359  ?BP: 120/79 (!) 95/50  ?Resp: 16 15  ?Temp: 36.6 ?C (!) 36.4 ?C  ?SpO2: 98% 99%  ?  ?Last Pain:  ?Vitals:  ? 02/28/22 1359  ?TempSrc: Oral  ?PainSc: 0-No pain  ? ? ?  ?  ?  ?  ?  ?  ? ?Louann Sjogren ? ? ? ? ?

## 2022-03-02 LAB — SURGICAL PATHOLOGY

## 2022-03-07 ENCOUNTER — Encounter (HOSPITAL_COMMUNITY): Payer: Self-pay | Admitting: Gastroenterology

## 2022-03-22 ENCOUNTER — Encounter: Payer: Medicare Other | Attending: Physical Medicine and Rehabilitation | Admitting: Physical Medicine & Rehabilitation

## 2022-05-18 ENCOUNTER — Encounter (HOSPITAL_COMMUNITY): Payer: Self-pay | Admitting: Emergency Medicine

## 2022-05-18 ENCOUNTER — Emergency Department (HOSPITAL_COMMUNITY)
Admission: EM | Admit: 2022-05-18 | Discharge: 2022-05-18 | Disposition: A | Discharge status: Home / Self Care | Payer: Medicare Other | Attending: Emergency Medicine | Admitting: Emergency Medicine

## 2022-05-18 ENCOUNTER — Emergency Department (HOSPITAL_COMMUNITY): Payer: Medicare Other

## 2022-05-18 DIAGNOSIS — J449 Chronic obstructive pulmonary disease, unspecified: Secondary | ICD-10-CM | POA: Diagnosis not present

## 2022-05-18 DIAGNOSIS — M7918 Myalgia, other site: Secondary | ICD-10-CM | POA: Diagnosis not present

## 2022-05-18 DIAGNOSIS — R52 Pain, unspecified: Secondary | ICD-10-CM | POA: Insufficient documentation

## 2022-05-18 DIAGNOSIS — R519 Headache, unspecified: Secondary | ICD-10-CM | POA: Insufficient documentation

## 2022-05-18 DIAGNOSIS — R0602 Shortness of breath: Secondary | ICD-10-CM | POA: Diagnosis not present

## 2022-05-18 DIAGNOSIS — Z87891 Personal history of nicotine dependence: Secondary | ICD-10-CM | POA: Diagnosis not present

## 2022-05-18 DIAGNOSIS — M542 Cervicalgia: Secondary | ICD-10-CM | POA: Diagnosis not present

## 2022-05-18 NOTE — ED Triage Notes (Signed)
Pt arrives POV with multiple complaints. PT states tonight he had a cramping pain to his right forearm followed by an "electric pain" that went all the way down his left side.  Pt states this has happened before but he wasn't seen for it. Also c/o some SOB for the past 3 days with pain in his shoulder blades.   Pt ambulatory to treatment room with no distress noted.

## 2022-05-18 NOTE — Discharge Instructions (Signed)
You were evaluated in the Emergency Department and after careful evaluation, we did not find any emergent condition requiring admission or further testing in the hospital.  Your exam/testing today was overall reassuring.  CT scans, EKG, chest x-ray were normal.  Recommend follow-up with your primary care doctor to further discuss your symptoms.  Please return to the Emergency Department if you experience any worsening of your condition.  Thank you for allowing [place] to be a part of your care.

## 2022-05-18 NOTE — ED Provider Notes (Signed)
[facility] Emergency Department Provider Note MRN:  [id number]  Arrival date & time: 05/18/22     Chief Complaint   Multiple Complaints   History of Present Illness   Warren Miller is a 51 y.o. year-old male with a history of COPD presenting to the ED with chief complaint of multiple complaints.  Patient endorsing frequent lightning strike type pain that involves the entire left side of his body.  Happens occasionally, always seems to happen when he is laying flat in bed trying to sleep.  Also experiencing shortness of breath that feels like anxiety.  No chest pain.  No abdominal pain.  Currently without numbness or weakness to the arms or legs, no pain, no bowel or bladder dysfunction.  Review of Systems  A thorough review of systems was obtained and all systems are negative except as noted in the HPI and PMH.   Patient's Health History    Past Medical History:  Diagnosis Date   Anxiety disorder    Chronic abdominal pain    COPD (chronic obstructive pulmonary disease) ([place])    Former heavy cigarette smoker (20-39 per day)    GERD (gastroesophageal reflux disease)    H. pylori infection    Hiatal hernia     Past Surgical History:  Procedure Laterality Date   BIOPSY  02/28/2022   Procedure: BIOPSY;  Surgeon: Harvel Quale, MD;  [facility];  Service: Gastroenterology;;   COLONOSCOPY WITH PROPOFOL N/A 02/28/2022   Procedure: COLONOSCOPY WITH PROPOFOL;  Surgeon: Harvel Quale, MD;  [facility];  Service: Gastroenterology;  Laterality: N/A;  1105   ESOPHAGOGASTRODUODENOSCOPY (EGD) WITH PROPOFOL N/A 02/28/2022   Procedure: ESOPHAGOGASTRODUODENOSCOPY (EGD) WITH PROPOFOL;  Surgeon: Harvel Quale, MD;  [facility];  Service: Gastroenterology;  Laterality: N/A;   HERNIA REPAIR     x 2, inguinal, bilateral   POLYPECTOMY  02/28/2022   Procedure: POLYPECTOMY;  Surgeon: Harvel Quale,  MD;  [facility];  Service: Gastroenterology;;   SUBMUCOSAL LIFTING INJECTION  02/28/2022   Procedure: SUBMUCOSAL LIFTING INJECTION;  Surgeon: Montez Morita, Quillian Quince, MD;  [facility];  Service: Gastroenterology;;    Family History  Problem Relation Age of Onset   Liver disease Mother    Alcoholism Mother     Social History   Socioeconomic History   Marital status: Legally Separated    Spouse name: Kenney Houseman   Number of children: 2   Years of education: 12   Highest education level: Not on file  Occupational History   Occupation: disabled  Tobacco Use   Smoking status: Former    Packs/day: 0.25    Types: Cigarettes    Quit date: 09/18/2004    Years since quitting: 17.6   Smokeless tobacco: Current    Types: Chew   Tobacco comments:    2 or more cans smokeless per week, 09/07/15 smoking 4-5 cigs daily - quit  Vaping Use   Vaping Use: Never used  Substance and Sexual Activity   Alcohol use: No    Alcohol/week: 0.0 standard drinks of alcohol   Drug use: No   Sexual activity: Yes    Birth control/protection: None  Other Topics Concern   Not on file  Social History Narrative   Lives alone , 2 children   Sometimes his son stays with him   Caffeine use- none to rare   Chews tobacco   Social Determinants of Health   Financial  Resource Strain: Low Risk  (11/13/2021)   Overall Financial Resource Strain (CARDIA)    Difficulty of Paying Living Expenses: Not very hard  Food Insecurity: No Food Insecurity (11/13/2021)   Hunger Vital Sign    Worried About Running Out of Food in the Last Year: Never true    Ran Out of Food in the Last Year: Never true  Transportation Needs: No Transportation Needs (11/13/2021)   PRAPARE - Hydrologist (Medical): No    Lack of Transportation (Non-Medical): No  Physical Activity: Inactive (11/13/2021)   Exercise Vital Sign    Days of Exercise per Week: 0 days    Minutes of Exercise per Session: 0  min  Stress: Stress Concern Present (11/13/2021)   [place]    Feeling of Stress : Rather much  Social Connections: Socially Isolated (11/13/2021)   Social Connection and Isolation Panel [NHANES]    Frequency of Communication with Friends and Family: More than three times a week    Frequency of Social Gatherings with Friends and Family: Twice a week    Attends Religious Services: Never    Marine scientist or Organizations: No    Attends Archivist Meetings: Never    Marital Status: Divorced  Human resources officer Violence: Not At Risk (11/13/2021)   Humiliation, Afraid, Rape, and Kick questionnaire    Fear of Current or Ex-Partner: No    Emotionally Abused: No    Physically Abused: No    Sexually Abused: No     Physical Exam   Vitals:   05/18/22 0351 05/18/22 0356  BP: 128/67   Pulse:  63  Resp:  13  Temp:  97.7 F (36.5 C)  SpO2:  100%    CONSTITUTIONAL: Chronically ill-appearing, NAD NEURO/PSYCH:  Alert and oriented x 3, normal and symmetric strength and sensation, normal coordination, normal speech EYES:  eyes equal and reactive ENT/NECK:  no LAD, no JVD CARDIO: Regular rate, well-perfused, normal S1 and S2 PULM:  CTAB no wheezing or rhonchi GI/GU:  non-distended, non-tender MSK/SPINE:  No gross deformities, no edema SKIN:  no rash, atraumatic   *Additional and/or pertinent findings included in MDM below  Diagnostic and Interventional Summary    EKG Interpretation  Date/Time:  May 18, 2022 and 03:56:28 Ventricular Rate:   85 PR Interval:   134 QRS Duration:  91 QT Interval: 393   QTC Calculation:468   R Axis:     Text Interpretation: Sinus rhythm       Labs Reviewed - No data to display  DG Chest 2 View  Final Result    CT HEAD WO CONTRAST (5MM)  Final Result    CT CERVICAL SPINE WO CONTRAST  Final Result      Medications - No data to display   Procedures  /   Critical Care Procedures  ED Course and Medical Decision Making  Initial Impression and Ddx Shooting type very brief pain involving the entire left side of the body, he does not really know where the pain starts, either the head or the neck or the shoulder.  Has been happening on and off for several months.  Differential diagnosis includes cervical spinal stenosis, pneumothorax, some other type of neuropathic pain.  Obtaining screening CT scans of the head and neck, screening EKG and chest x-ray, anticipating discharge.  Past medical/surgical history that increases complexity of ED encounter: COPD  Interpretation of Diagnostics I personally reviewed  the EKG and my interpretation is as follows: Sinus rhythm  Chest x-ray normal, CT imaging normal  Patient Reassessment and Ultimate Disposition/Management     Discharge home  Patient management required discussion with the following services or consulting groups:  None  Complexity of Problems Addressed Acute illness or injury that poses threat of life of bodily function  Additional Data Reviewed and Analyzed Further history obtained from: None  Additional Factors Impacting ED Encounter Risk None  Barth Kirks. Sedonia Small, MD [place] [email or web address]  Final Clinical Impressions(s) / ED Diagnoses     ICD-10-CM   1. Shooting pain  R52       ED Discharge Orders     None        Discharge Instructions Discussed with and Provided to Patient:     Discharge Instructions      You were evaluated in the Emergency Department and after careful evaluation, we did not find any emergent condition requiring admission or further testing in the hospital.  Your exam/testing today was overall reassuring.  CT scans, EKG, chest x-ray were normal.  Recommend follow-up with your primary care doctor to further discuss your symptoms.  Please return to the Emergency Department if you experience  any worsening of your condition.  Thank you for allowing [place] to be a part of your care.        Maudie Flakes, MD 05/18/22 [phone]

## 2022-11-14 ENCOUNTER — Ambulatory Visit (INDEPENDENT_AMBULATORY_CARE_PROVIDER_SITE_OTHER): Payer: Medicare Other

## 2022-11-14 VITALS — Ht 73.0 in | Wt 170.0 lb

## 2022-11-14 DIAGNOSIS — Z0000 Encounter for general adult medical examination without abnormal findings: Secondary | ICD-10-CM | POA: Diagnosis not present

## 2022-11-14 NOTE — Progress Notes (Signed)
Subjective:   Warren Miller is a 52 y.o. male who presents for Medicare Annual/Subsequent preventive examination. I connected with  Pastor Sgro Boeve on 11/14/22 by a audio enabled telemedicine application and verified that I am speaking with the correct person using two identifiers.  Patient Location: [place]  Provider Location: Home Office  I discussed the limitations of evaluation and management by telemedicine. The patient expressed understanding and agreed to proceed.  Review of Systems     Cardiac Risk Factors include: advanced age (>43mn, >>2women)     Objective:    Today's Vitals   11/14/22 1407  Weight: 170 lb (77.1 kg)  Height: '6\' 1"'$  (1.854 m)   Body mass index is 22.43 kg/m.     11/14/2022    2:10 PM 05/18/2022    3:54 AM 02/28/2022   12:05 PM 06/23/2021    1:07 AM 11/30/2019    4:59 PM 08/23/2017   11:04 PM 02/22/2016    3:06 PM  Advanced Directives  Does Patient Have a Medical Advance Directive? No No No No No No No  Would patient like information on creating a medical advance directive? No - Patient declined No - Patient declined No - Patient declined    No - patient declined information    Current Medications (verified) Outpatient Encounter Medications as of 11/14/2022  Medication Sig   albuterol (VENTOLIN HFA) 108 (90 Base) MCG/ACT inhaler Inhale 2 puffs into the lungs every 6 (six) hours as needed for wheezing or shortness of breath.   Ascorbic Acid (VITAMIN C PO) Take 1 tablet by mouth daily.   Cholecalciferol (VITAMIN D3 PO) Take 1 capsule by mouth daily.   hydrOXYzine (VISTARIL) 25 MG capsule Take 25 mg by mouth every 8 (eight) hours as needed for anxiety.   linaclotide (LINZESS) 72 MCG capsule Take 1 capsule (72 mcg total) by mouth daily before breakfast.   omeprazole (PRILOSEC) 20 MG capsule Take 1 capsule (20 mg total) by mouth 2 (two) times daily before a meal.   No facility-administered encounter medications on file as of 11/14/2022.     Allergies (verified) Other and Tetracyclines & related   History: Past Medical History:  Diagnosis Date   Anxiety disorder    Chronic abdominal pain    COPD (chronic obstructive pulmonary disease) ([facility])    Former heavy cigarette smoker (20-39 per day)    GERD (gastroesophageal reflux disease)    H. pylori infection    Hiatal hernia    Past Surgical History:  Procedure Laterality Date   BIOPSY  02/28/2022   Procedure: BIOPSY;  Surgeon: CHarvel Quale MD;  [facility];  Service: Gastroenterology;;   COLONOSCOPY WITH PROPOFOL N/A 02/28/2022   Procedure: COLONOSCOPY WITH PROPOFOL;  Surgeon: CHarvel Quale MD;  [facility];  Service: Gastroenterology;  Laterality: N/A;  1105   ESOPHAGOGASTRODUODENOSCOPY (EGD) WITH PROPOFOL N/A 02/28/2022   Procedure: ESOPHAGOGASTRODUODENOSCOPY (EGD) WITH PROPOFOL;  Surgeon: CHarvel Quale MD;  [facility];  Service: Gastroenterology;  Laterality: N/A;   HERNIA REPAIR     x 2, inguinal, bilateral   POLYPECTOMY  02/28/2022   Procedure: POLYPECTOMY;  Surgeon: CHarvel Quale MD;  [facility];  Service: Gastroenterology;;   SUBMUCOSAL LIFTING INJECTION  02/28/2022   Procedure: SUBMUCOSAL LIFTING INJECTION;  Surgeon: CHarvel Quale MD;  [facility];  Service: Gastroenterology;;   Family History  Problem Relation Age of Onset   Liver disease  Mother    Alcoholism Mother    Social History   Socioeconomic History   Marital status: Legally Separated    Spouse name: Kenney Houseman   Number of children: 2   Years of education: 12   Highest education level: Not on file  Occupational History   Occupation: disabled  Tobacco Use   Smoking status: Former    Packs/day: 0.25    Types: Cigarettes    Quit date: 09/18/2004    Years since quitting: 18.1   Smokeless tobacco: Current    Types: Chew   Tobacco comments:    2 or more cans smokeless per week,  09/07/15 smoking 4-5 cigs daily - quit  Vaping Use   Vaping Use: Never used  Substance and Sexual Activity   Alcohol use: No    Alcohol/week: 0.0 standard drinks of alcohol   Drug use: No   Sexual activity: Yes    Birth control/protection: None  Other Topics Concern   Not on file  Social History Narrative   Lives alone , 2 children   Sometimes his son stays with him   Caffeine use- none to rare   Chews tobacco   Social Determinants of Health   Financial Resource Strain: Low Risk  (11/14/2022)   Overall Financial Resource Strain (CARDIA)    Difficulty of Paying Living Expenses: Not hard at all  Food Insecurity: No Food Insecurity (11/14/2022)   Hunger Vital Sign    Worried About Running Out of Food in the Last Year: Never true    Ran Out of Food in the Last Year: Never true  Transportation Needs: No Transportation Needs (11/14/2022)   PRAPARE - Hydrologist (Medical): No    Lack of Transportation (Non-Medical): No  Physical Activity: Inactive (11/14/2022)   Exercise Vital Sign    Days of Exercise per Week: 0 days    Minutes of Exercise per Session: 0 min  Stress: No Stress Concern Present (11/14/2022)   [place]    Feeling of Stress : Only a little  Social Connections: Socially Isolated (11/14/2022)   Social Connection and Isolation Panel [NHANES]    Frequency of Communication with Friends and Family: More than three times a week    Frequency of Social Gatherings with Friends and Family: More than three times a week    Attends Religious Services: Never    Marine scientist or Organizations: No    Attends Archivist Meetings: Never    Marital Status: Divorced    Tobacco Counseling Ready to quit: Not Answered Counseling given: Not Answered Tobacco comments: 2 or more cans smokeless per week, 09/07/15 smoking 4-5 cigs daily - quit   Clinical Intake:  Pre-visit  preparation completed: Yes  Pain : No/denies pain     Nutritional Risks: None Diabetes: No  How often do you need to have someone help you when you read instructions, pamphlets, or other written materials from your doctor or pharmacy?: 1 - Never  Diabetic?yes   Interpreter Needed?: No  Information entered by :: Jadene Pierini, LPN   Activities of Daily Living    11/14/2022    2:10 PM  In your present state of health, do you have any difficulty performing the following activities:  Hearing? 0  Vision? 0  Difficulty concentrating or making decisions? 0  Walking or climbing stairs? 0  Dressing or bathing? 0  Doing errands, shopping? 0  Preparing Food and  eating ? N  Using the Toilet? N  In the past six months, have you accidently leaked urine? N  Do you have problems with loss of bowel control? N  Managing your Medications? N  Managing your Finances? N  Housekeeping or managing your Housekeeping? N    Patient Care Team: Sharion Balloon, FNP as PCP - General (Family Medicine)  Indicate any recent Medical Services you may have received from other than [facility] providers in the past year (date may be approximate).     Assessment:   This is a routine wellness examination for [place].  Hearing/Vision screen Vision Screening - Comments:: Referral declined   Dietary issues and exercise activities discussed: Current Exercise Habits: The patient does not participate in regular exercise at present   Goals Addressed             This Visit's Progress    Stop or Cut Down Tobacco Use   On track    Timeframe:  Long-Range Goal Priority:  High Start Date:                             Expected End Date:                       Follow Up Date 11/14/2022    - cut down amount of tobacco product used (chew, cigars) - drink 4-6 glasses of water each day - use over-the-counter gum, patch or lozenges    Why is this important?   To stop or cut down it is important to have support from  a person or group of people who you can count on.  You will also need to think about the things that make you feel like smoking, then plan for how to handle them.    Notes:        Depression Screen    11/14/2022    2:09 PM 11/13/2021    3:35 PM 08/28/2021    4:41 PM 12/26/2018   10:27 AM 09/19/2018   11:09 AM 08/19/2018   11:03 AM 07/31/2017    4:02 PM  PHQ 2/9 Scores  PHQ - 2 Score 0 '4 4 2 4 6 6  '$ PHQ- 9 Score  '15 16 11 15 21 16    '$ Fall Risk    11/14/2022    2:08 PM 11/13/2021    3:59 PM 05/29/2017    4:13 PM 09/07/2015   10:41 AM 08/15/2015   10:54 AM  Fall Risk   Falls in the past year? 0 1 No Yes Yes  Number falls in past yr: 0 0  1 1  Injury with Fall? 0 1  Yes Yes  Comment    injured right wrist wrist  Risk for fall due to : No Fall Risks History of fall(s);Impaired balance/gait;Orthopedic patient  Impaired mobility   Risk for fall due to: Comment    tripped on porch, didn't pick up R leg far enough   Follow up Falls prevention discussed Falls prevention discussed       FALL RISK PREVENTION PERTAINING TO THE HOME:  Any stairs in or around the home? No  If so, are there any without handrails? No  Home free of loose throw rugs in walkways, pet beds, electrical cords, etc? Yes  Adequate lighting in your home to reduce risk of falls? Yes   ASSISTIVE DEVICES UTILIZED TO PREVENT FALLS:  Life alert? No  Use  of a cane, walker or w/c? No  Grab bars in the bathroom? No  Shower chair or bench in shower? No  Elevated toilet seat or a handicapped toilet? No          11/14/2022    2:10 PM 11/13/2021    3:40 PM  6CIT Screen  What Year? 0 points 0 points  What month? 0 points 0 points  What time? 0 points 0 points  Count back from 20 0 points 0 points  Months in reverse 0 points 0 points  Repeat phrase 0 points 4 points  Total Score 0 points 4 points    Immunizations Immunization History  Administered Date(s) Administered   Pneumococcal Polysaccharide-23 12/29/2013    Tdap 08/08/2012, 12/29/2013, 07/29/2015    TDAP status: Up to date  Flu Vaccine status: Up to date  Pneumococcal vaccine status: Up to date  Covid-19 vaccine status: Information provided on how to obtain vaccines.   Qualifies for Shingles Vaccine? Yes   Zostavax completed No   Shingrix Completed?: No.    Education has been provided regarding the importance of this vaccine. Patient has been advised to call insurance company to determine out of pocket expense if they have not yet received this vaccine. Advised may also receive vaccine at local pharmacy or Health Dept. Verbalized acceptance and understanding.  Screening Tests Health Maintenance  Topic Date Due   COVID-19 Vaccine (1) Never done   Hepatitis C Screening  Never done   Zoster Vaccines- Shingrix (1 of 2) Never done   INFLUENZA VACCINE  Never done   Medicare Annual Wellness (AWV)  11/15/2023   DTaP/Tdap/Td (4 - Td or Tdap) 07/28/2025   COLONOSCOPY (Pts 45-54yr Insurance coverage will need to be confirmed)  03/01/2027   HIV Screening  Completed   HPV VACCINES  Aged Out    Health Maintenance  Health Maintenance Due  Topic Date Due   COVID-19 Vaccine (1) Never done   Hepatitis C Screening  Never done   Zoster Vaccines- Shingrix (1 of 2) Never done   INFLUENZA VACCINE  Never done    Colorectal cancer screening: Type of screening: Colonoscopy. Completed 02/28/2022. Repeat every 5 years  Lung Cancer Screening: (Low Dose CT Chest recommended if Age 54-80years, 30 pack-year currently smoking OR have quit w/in 15years.) does not qualify.   Lung Cancer Screening Referral: n/a  Additional Screening:  Hepatitis C Screening: does not qualify;   Vision Screening: Recommended annual ophthalmology exams for early detection of glaucoma and other disorders of the eye. Is the patient up to date with their annual eye exam?  No  Who is the provider or what is the name of the office in which the patient attends annual eye  exams? None declined referral  If pt is not established with a provider, would they like to be referred to a provider to establish care? No .   Dental Screening: Recommended annual dental exams for proper oral hygiene  Community Resource Referral / Chronic Care Management: CRR required this visit?  No   CCM required this visit?  No      Plan:     I have personally reviewed and noted the following in the patient's chart:   Medical and social history Use of alcohol, tobacco or illicit drugs  Current medications and supplements including opioid prescriptions. Patient is not currently taking opioid prescriptions. Functional ability and status Nutritional status Physical activity Advanced directives List of other physicians Hospitalizations, surgeries, and ER visits  in previous 12 months Vitals Screenings to include cognitive, depression, and falls Referrals and appointments  In addition, I have reviewed and discussed with patient certain preventive protocols, quality metrics, and best practice recommendations. A written personalized care plan for preventive services as well as general preventive health recommendations were provided to patient.     Daphane Shepherd, LPN   0/28/9022   Nurse Notes: Declined Eye Referral

## 2022-11-14 NOTE — Patient Instructions (Signed)
Mr. Warren Miller , Thank you for taking time to come for your Medicare Wellness Visit. I appreciate your ongoing commitment to your health goals. Please review the following plan we discussed and let me know if I can assist you in the future.   These are the goals we discussed:  Goals      Stop or Cut Down Tobacco Use     Timeframe:  Long-Range Goal Priority:  High Start Date:                             Expected End Date:                       Follow Up Date 11/14/2022    - cut down amount of tobacco product used (chew, cigars) - drink 4-6 glasses of water each day - use over-the-counter gum, patch or lozenges    Why is this important?   To stop or cut down it is important to have support from a person or group of people who you can count on.  You will also need to think about the things that make you feel like smoking, then plan for how to handle them.    Notes:         This is a list of the screening recommended for you and due dates:  Health Maintenance  Topic Date Due   COVID-19 Vaccine (1) Never done   Hepatitis C Screening: USPSTF Recommendation to screen - Ages 33-79 yo.  Never done   Zoster (Shingles) Vaccine (1 of 2) Never done   Flu Shot  Never done   Medicare Annual Wellness Visit  11/15/2023   DTaP/Tdap/Td vaccine (4 - Td or Tdap) 07/28/2025   Colon Cancer Screening  03/01/2027   HIV Screening  Completed   HPV Vaccine  Aged Out    Advanced directives: Advance directive discussed with you today. I have provided a copy for you to complete at home and have notarized. Once this is complete please bring a copy in to our office so we can scan it into your chart.   Conditions/risks identified: Aim for 30 minutes of exercise or brisk walking, 6-8 glasses of water, and 5 servings of fruits and vegetables each day.   Next appointment: Follow up in one year for your annual wellness visit   Preventive Care 40-64 Years, Male Preventive care refers to lifestyle choices and  visits with your health care provider that can promote health and wellness. What does preventive care include? A yearly physical exam. This is also called an annual well check. Dental exams once or twice a year. Routine eye exams. Ask your health care provider how often you should have your eyes checked. Personal lifestyle choices, including: Daily care of your teeth and gums. Regular physical activity. Eating a healthy diet. Avoiding tobacco and drug use. Limiting alcohol use. Practicing safe sex. Taking low-dose aspirin every day starting at age 34. What happens during an annual well check? The services and screenings done by your health care provider during your annual well check will depend on your age, overall health, lifestyle risk factors, and family history of disease. Counseling  Your health care provider may ask you questions about your: Alcohol use. Tobacco use. Drug use. Emotional well-being. Home and relationship well-being. Sexual activity. Eating habits. Work and work Statistician. Screening  You may have the following tests or measurements: Height, weight, and BMI.  Blood pressure. Lipid and cholesterol levels. These may be checked every 5 years, or more frequently if you are over 23 years old. Skin check. Lung cancer screening. You may have this screening every year starting at age 39 if you have a 30-pack-year history of smoking and currently smoke or have quit within the past 15 years. Fecal occult blood test (FOBT) of the stool. You may have this test every year starting at age 38. Flexible sigmoidoscopy or colonoscopy. You may have a sigmoidoscopy every 5 years or a colonoscopy every 10 years starting at age 77. Prostate cancer screening. Recommendations will vary depending on your family history and other risks. Hepatitis C blood test. Hepatitis B blood test. Sexually transmitted disease (STD) testing. Diabetes screening. This is done by checking your blood  sugar (glucose) after you have not eaten for a while (fasting). You may have this done every 1-3 years. Discuss your test results, treatment options, and if necessary, the need for more tests with your health care provider. Vaccines  Your health care provider may recommend certain vaccines, such as: Influenza vaccine. This is recommended every year. Tetanus, diphtheria, and acellular pertussis (Tdap, Td) vaccine. You may need a Td booster every 10 years. Zoster vaccine. You may need this after age 92. Pneumococcal 13-valent conjugate (PCV13) vaccine. You may need this if you have certain conditions and have not been vaccinated. Pneumococcal polysaccharide (PPSV23) vaccine. You may need one or two doses if you smoke cigarettes or if you have certain conditions. Talk to your health care provider about which screenings and vaccines you need and how often you need them. This information is not intended to replace advice given to you by your health care provider. Make sure you discuss any questions you have with your health care provider. Document Released: 11/11/2015 Document Revised: 07/04/2016 Document Reviewed: 08/16/2015 Elsevier Interactive Patient Education  2017 [place] Prevention in the Home Falls can cause injuries. They can happen to people of all ages. There are many things you can do to make your home safe and to help prevent falls. What can I do on the outside of my home? Regularly fix the edges of walkways and driveways and fix any cracks. Remove anything that might make you trip as you walk through a door, such as a raised step or threshold. Trim any bushes or trees on the path to your home. Use bright outdoor lighting. Clear any walking paths of anything that might make someone trip, such as rocks or tools. Regularly check to see if handrails are loose or broken. Make sure that both sides of any steps have handrails. Any raised decks and porches should have guardrails  on the edges. Have any leaves, snow, or ice cleared regularly. Use sand or salt on walking paths during winter. Clean up any spills in your garage right away. This includes oil or grease spills. What can I do in the bathroom? Use night lights. Install grab bars by the toilet and in the tub and shower. Do not use towel bars as grab bars. Use non-skid mats or decals in the tub or shower. If you need to sit down in the shower, use a plastic, non-slip stool. Keep the floor dry. Clean up any water that spills on the floor as soon as it happens. Remove soap buildup in the tub or shower regularly. Attach bath mats securely with double-sided non-slip rug tape. Do not have throw rugs and other things on the floor that can make  you trip. What can I do in the bedroom? Use night lights. Make sure that you have a light by your bed that is easy to reach. Do not use any sheets or blankets that are too big for your bed. They should not hang down onto the floor. Have a firm chair that has side arms. You can use this for support while you get dressed. Do not have throw rugs and other things on the floor that can make you trip. What can I do in the kitchen? Clean up any spills right away. Avoid walking on wet floors. Keep items that you use a lot in easy-to-reach places. If you need to reach something above you, use a strong step stool that has a grab bar. Keep electrical cords out of the way. Do not use floor polish or wax that makes floors slippery. If you must use wax, use non-skid floor wax. Do not have throw rugs and other things on the floor that can make you trip. What can I do with my stairs? Do not leave any items on the stairs. Make sure that there are handrails on both sides of the stairs and use them. Fix handrails that are broken or loose. Make sure that handrails are as long as the stairways. Check any carpeting to make sure that it is firmly attached to the stairs. Fix any carpet that is  loose or worn. Avoid having throw rugs at the top or bottom of the stairs. If you do have throw rugs, attach them to the floor with carpet tape. Make sure that you have a light switch at the top of the stairs and the bottom of the stairs. If you do not have them, ask someone to add them for you. What else can I do to help prevent falls? Wear shoes that: Do not have high heels. Have rubber bottoms. Are comfortable and fit you well. Are closed at the toe. Do not wear sandals. If you use a stepladder: Make sure that it is fully opened. Do not climb a closed stepladder. Make sure that both sides of the stepladder are locked into place. Ask someone to hold it for you, if possible. Clearly mark and make sure that you can see: Any grab bars or handrails. First and last steps. Where the edge of each step is. Use tools that help you move around (mobility aids) if they are needed. These include: Canes. Walkers. Scooters. Crutches. Turn on the lights when you go into a dark area. Replace any light bulbs as soon as they burn out. Set up your furniture so you have a clear path. Avoid moving your furniture around. If any of your floors are uneven, fix them. If there are any pets around you, be aware of where they are. Review your medicines with your doctor. Some medicines can make you feel dizzy. This can increase your chance of falling. Ask your doctor what other things that you can do to help prevent falls. This information is not intended to replace advice given to you by your health care provider. Make sure you discuss any questions you have with your health care provider. Document Released: 08/11/2009 Document Revised: 03/22/2016 Document Reviewed: 11/19/2014 Elsevier Interactive Patient Education  2017 [employer].
